# Patient Record
Sex: Female | Born: 1965 | Race: Black or African American | Hispanic: No | Marital: Single | Smoking: Never smoker
Health system: Southern US, Community
[De-identification: ages and names within clinical notes are randomized; demographics above are authoritative.]

## PROBLEM LIST (undated history)

## (undated) DIAGNOSIS — J189 Pneumonia, unspecified organism: Secondary | ICD-10-CM

## (undated) DIAGNOSIS — M1990 Unspecified osteoarthritis, unspecified site: Secondary | ICD-10-CM

## (undated) DIAGNOSIS — D573 Sickle-cell trait: Secondary | ICD-10-CM

## (undated) DIAGNOSIS — T7840XA Allergy, unspecified, initial encounter: Secondary | ICD-10-CM

## (undated) DIAGNOSIS — J45909 Unspecified asthma, uncomplicated: Secondary | ICD-10-CM

## (undated) DIAGNOSIS — R000 Tachycardia, unspecified: Secondary | ICD-10-CM

## (undated) DIAGNOSIS — K859 Acute pancreatitis without necrosis or infection, unspecified: Secondary | ICD-10-CM

## (undated) DIAGNOSIS — I10 Essential (primary) hypertension: Secondary | ICD-10-CM

## (undated) HISTORY — DX: Unspecified osteoarthritis, unspecified site: M19.90

## (undated) HISTORY — DX: Acute pancreatitis without necrosis or infection, unspecified: K85.90

## (undated) HISTORY — DX: Tachycardia, unspecified: R00.0

## (undated) HISTORY — DX: Unspecified asthma, uncomplicated: J45.909

## (undated) HISTORY — DX: Allergy, unspecified, initial encounter: T78.40XA

## (undated) HISTORY — DX: Pneumonia, unspecified organism: J18.9

## (undated) HISTORY — PX: LUNG LOBECTOMY: SHX167

## (undated) HISTORY — PX: WISDOM TOOTH EXTRACTION: SHX21

## (undated) HISTORY — PX: INGUINAL HERNIA REPAIR: SUR1180

## (undated) HISTORY — DX: Sickle-cell trait: D57.3

## (undated) HISTORY — DX: Essential (primary) hypertension: I10

## (undated) SURGERY — Surgical Case
Anesthesia: *Unknown

---

## 2013-10-15 DIAGNOSIS — I471 Supraventricular tachycardia: Secondary | ICD-10-CM | POA: Insufficient documentation

## 2019-10-29 DIAGNOSIS — R002 Palpitations: Secondary | ICD-10-CM | POA: Insufficient documentation

## 2020-01-02 DIAGNOSIS — Z6841 Body Mass Index (BMI) 40.0 and over, adult: Secondary | ICD-10-CM | POA: Insufficient documentation

## 2020-01-24 ENCOUNTER — Emergency Department (HOSPITAL_COMMUNITY)
Admission: EM | Admit: 2020-01-24 | Discharge: 2020-01-25 | Disposition: A | Discharge status: Home / Self Care | Payer: Federal, State, Local not specified - PPO | Attending: Emergency Medicine | Admitting: Emergency Medicine

## 2020-01-24 ENCOUNTER — Encounter (HOSPITAL_COMMUNITY): Payer: Self-pay

## 2020-01-24 ENCOUNTER — Other Ambulatory Visit: Payer: Self-pay

## 2020-01-24 DIAGNOSIS — R1010 Upper abdominal pain, unspecified: Secondary | ICD-10-CM | POA: Diagnosis present

## 2020-01-24 DIAGNOSIS — K859 Acute pancreatitis without necrosis or infection, unspecified: Secondary | ICD-10-CM | POA: Diagnosis not present

## 2020-01-24 DIAGNOSIS — A599 Trichomoniasis, unspecified: Secondary | ICD-10-CM | POA: Diagnosis not present

## 2020-01-24 LAB — COMPREHENSIVE METABOLIC PANEL
ALT: 17 U/L (ref 0–44)
AST: 16 U/L (ref 15–41)
Albumin: 3.7 g/dL (ref 3.5–5.0)
Alkaline Phosphatase: 70 U/L (ref 38–126)
Anion gap: 9 (ref 5–15)
BUN: 7 mg/dL (ref 6–20)
CO2: 28 mmol/L (ref 22–32)
Calcium: 9.5 mg/dL (ref 8.9–10.3)
Chloride: 101 mmol/L (ref 98–111)
Creatinine, Ser: 0.72 mg/dL (ref 0.44–1.00)
GFR calc Af Amer: >60 mL/min (ref >60)
GFR calc non Af Amer: >60 mL/min (ref >60)
Glucose, Bld: 123 mg/dL — ABNORMAL HIGH (ref 70–99)
Potassium: 4.6 mmol/L (ref 3.5–5.1)
Sodium: 138 mmol/L (ref 135–145)
Total Bilirubin: 0.6 mg/dL (ref 0.3–1.2)
Total Protein: 7.6 g/dL (ref 6.5–8.1)

## 2020-01-24 LAB — URINALYSIS, ROUTINE W REFLEX MICROSCOPIC
Bilirubin Urine: NEGATIVE
Glucose, UA: NEGATIVE mg/dL
Ketones, ur: NEGATIVE mg/dL
Nitrite: NEGATIVE
Protein, ur: NEGATIVE mg/dL
Specific Gravity, Urine: 1.01 (ref 1.005–1.030)
pH: 5 (ref 5.0–8.0)

## 2020-01-24 LAB — CBC
HCT: 38.4 % (ref 36.0–46.0)
Hemoglobin: 12.4 g/dL (ref 12.0–15.0)
MCH: 27.1 pg (ref 26.0–34.0)
MCHC: 32.3 g/dL (ref 30.0–36.0)
MCV: 84 fL (ref 80.0–100.0)
Platelets: 316 10*3/uL (ref 150–400)
RBC: 4.57 MIL/uL (ref 3.87–5.11)
RDW: 12.8 % (ref 11.5–15.5)
WBC: 9.7 10*3/uL (ref 4.0–10.5)
nRBC: 0 % (ref 0.0–0.2)

## 2020-01-24 LAB — I-STAT BETA HCG BLOOD, ED (MC, WL, AP ONLY): I-stat hCG, quantitative: <5 m[IU]/mL (ref <5)

## 2020-01-24 LAB — LIPASE, BLOOD: Lipase: 58 U/L — ABNORMAL HIGH (ref 11–51)

## 2020-01-24 MED ORDER — SODIUM CHLORIDE 0.9% FLUSH: 3.0000 mL | Freq: Once | INTRAVENOUS | Status: DC

## 2020-01-24 NOTE — ED Triage Notes (Signed)
Pt arrives to ED w/ c/o abdominal pain x 2 days. Pt endorses nausea but denies v/d. Pt reports 10/10 R/LUQ pain.

## 2020-01-25 ENCOUNTER — Emergency Department (HOSPITAL_COMMUNITY): Payer: Federal, State, Local not specified - PPO

## 2020-01-25 ENCOUNTER — Encounter: Payer: Self-pay | Admitting: Nurse Practitioner

## 2020-01-25 ENCOUNTER — Encounter (HOSPITAL_COMMUNITY): Payer: Self-pay | Admitting: Radiology

## 2020-01-25 MED ORDER — HYDROMORPHONE HCL 1 MG/ML IJ SOLN
1.0000 mg | Freq: Once | INTRAMUSCULAR | Status: AC
  Administered 2020-01-25: 1 mg via INTRAVENOUS
  Filled 2020-01-25: qty 1

## 2020-01-25 MED ORDER — IOHEXOL 300 MG/ML  SOLN
100.0000 mL | Freq: Once | INTRAMUSCULAR | Status: AC | PRN
  Administered 2020-01-25: 100 mL via INTRAVENOUS

## 2020-01-25 MED ORDER — MORPHINE SULFATE (PF) 4 MG/ML IV SOLN
4.0000 mg | Freq: Once | INTRAVENOUS | Status: AC
  Administered 2020-01-25: 4 mg via INTRAVENOUS
  Filled 2020-01-25: qty 1

## 2020-01-25 MED ORDER — OXYCODONE-ACETAMINOPHEN 5-325 MG PO TABS: 1.0000 | ORAL_TABLET | ORAL | 0 refills | Status: DC | PRN

## 2020-01-25 MED ORDER — ONDANSETRON HCL 4 MG/2ML IJ SOLN
4.0000 mg | Freq: Once | INTRAMUSCULAR | Status: AC
  Administered 2020-01-25: 4 mg via INTRAVENOUS
  Filled 2020-01-25: qty 2

## 2020-01-25 MED ORDER — SODIUM CHLORIDE 0.9 % IV BOLUS
1000.0000 mL | Freq: Once | INTRAVENOUS | Status: AC
  Administered 2020-01-25: 1000 mL via INTRAVENOUS

## 2020-01-25 MED ORDER — ONDANSETRON HCL 4 MG PO TABS: 4.0000 mg | ORAL_TABLET | Freq: Four times a day (QID) | ORAL | 0 refills | Status: DC

## 2020-01-25 NOTE — ED Provider Notes (Signed)
[facility] EMERGENCY DEPARTMENT Provider Note   CSN: [id number] Arrival date & time: 01/24/20  1949     History Chief Complaint  Patient presents with   Abdominal Pain    Alicia Ritter is a 54 y.o. female.  Patient presents to the emergency department for evaluation of abdominal pain.  Symptoms began, mild, 2 days ago.  24 hours ago her pain started to worsen.  Pain is sharp and constant across the upper abdomen.  She has had nausea without vomiting.  She has felt mildly constipated.  No fever.  Patient has never had similar pain.  She has had previous hernia surgery, no other abdominal surgeries.        History reviewed. No pertinent past medical history.  There are no problems to display for this patient.   History reviewed. No pertinent surgical history.   OB History   No obstetric history on file.     History reviewed. No pertinent family history.  Social History   Tobacco Use   Smoking status: Not on file  Substance Use Topics   Alcohol use: Not on file   Drug use: Not on file    Home Medications Prior to Admission medications   Medication Sig Start Date End Date Taking? Authorizing Provider  ondansetron (ZOFRAN) 4 MG tablet Take 1 tablet (4 mg total) by mouth every 6 (six) hours. 01/25/20   Orpah Greek, MD  oxyCODONE-acetaminophen (PERCOCET) 5-325 MG tablet Take 1-2 tablets by mouth every 4 (four) hours as needed. 01/25/20   Orpah Greek, MD    Allergies    Patient has no allergy information on record.  Review of Systems   Review of Systems  Gastrointestinal: Positive for abdominal pain, constipation and nausea.  All other systems reviewed and are negative.   Physical Exam Updated Vital Signs BP 133/81    Pulse 78    Temp 98.7 F (37.1 C) (Oral)    Resp 15    SpO2 98%   Physical Exam Vitals and nursing note reviewed.  Constitutional:      General: She is not in acute distress.    Appearance: Normal  appearance. She is well-developed.  HENT:     Head: Normocephalic and atraumatic.     Right Ear: Hearing normal.     Left Ear: Hearing normal.     Nose: Nose normal.  Eyes:     Conjunctiva/sclera: Conjunctivae normal.     Pupils: Pupils are equal, round, and reactive to light.  Cardiovascular:     Rate and Rhythm: Regular rhythm.     Heart sounds: S1 normal and S2 normal. No murmur. No friction rub. No gallop.   Pulmonary:     Effort: Pulmonary effort is normal. No respiratory distress.     Breath sounds: Normal breath sounds.  Chest:     Chest wall: No tenderness.  Abdominal:     General: Bowel sounds are normal.     Palpations: Abdomen is soft.     Tenderness: There is abdominal tenderness in the epigastric area. There is no guarding or rebound. Negative signs include Murphy's sign and McBurney's sign.     Hernia: No hernia is present.  Musculoskeletal:        General: Normal range of motion.     Cervical back: Normal range of motion and neck supple.  Skin:    General: Skin is warm and dry.     Findings: No rash.  Neurological:  Mental Status: She is alert and oriented to person, place, and time.     GCS: GCS eye subscore is 4. GCS verbal subscore is 5. GCS motor subscore is 6.     Cranial Nerves: No cranial nerve deficit.     Sensory: No sensory deficit.     Coordination: Coordination normal.  Psychiatric:        Speech: Speech normal.        Behavior: Behavior normal.        Thought Content: Thought content normal.     ED Results / Procedures / Treatments   Labs (all labs ordered are listed, but only abnormal results are displayed) Labs Reviewed  LIPASE, BLOOD - Abnormal; Notable for the following components:      Result Value   Lipase 58 (*)    All other components within normal limits  COMPREHENSIVE METABOLIC PANEL - Abnormal; Notable for the following components:   Glucose, Bld 123 (*)    All other components within normal limits  URINALYSIS, ROUTINE W  REFLEX MICROSCOPIC - Abnormal; Notable for the following components:   APPearance HAZY (*)    Hgb urine dipstick SMALL (*)    Leukocytes,Ua MODERATE (*)    Bacteria, UA RARE (*)    Trichomonas, UA PRESENT (*)    All other components within normal limits  CBC  I-STAT BETA HCG BLOOD, ED ([facility], [facility], [facility] ONLY)    EKG None  Radiology CT ABDOMEN PELVIS W CONTRAST  Result Date: 01/25/2020 CLINICAL DATA:  Upper abdominal pain and nausea for 2 days. EXAM: CT ABDOMEN AND PELVIS WITH CONTRAST TECHNIQUE: Multidetector CT imaging of the abdomen and pelvis was performed using the standard protocol following bolus administration of intravenous contrast. CONTRAST:  125mL OMNIPAQUE IOHEXOL 300 MG/ML  SOLN COMPARISON:  You have FINDINGS: Lower chest: Minimal atelectasis is present at the lung bases. The heart size is normal. No significant pleural or pericardial effusion is present. Hepatobiliary: Mild diffuse fatty infiltration of the liver is present. No discrete lesions are present. Common bile duct and gallbladder are normal. Pancreas: Inflammatory changes are present about the pancreatic head and uncinate process. The distal body and tail are normal. No mass lesion or fluid collection is present. Spleen: Normal in size without focal abnormality. Adrenals/Urinary Tract: The adrenal glands are normal bilaterally. Kidneys and ureters are within normal limits. The urinary bladder is within normal limits. Stomach/Bowel: The stomach is within normal limits. Portions the duodenum demonstrate inflammatory changes, likely secondary to the pancreatic inflammation. Small bowel is unremarkable. Terminal ileum is normal. The appendix is visualized and normal. The ascending and transverse colon are normal. Diverticular changes are present in the descending and sigmoid colon without focal inflammation to suggest diverticulitis. Vascular/Lymphatic: No significant vascular findings are present. No enlarged abdominal or pelvic lymph  nodes. Reproductive: Uterus and bilateral adnexa are unremarkable. Other: No abdominal wall hernia or abnormality. No abdominopelvic ascites. Musculoskeletal: Degenerative facet changes are greatest at L4-5 and worse on the left at L5-S1. Vertebral body heights are maintained. No focal lytic or blastic lesions are present. The bony pelvis is normal. Hips are located and within normal limits. IMPRESSION: 1. Inflammatory changes about the pancreatic head and uncinate process compatible with acute pancreatitis. No complicating features are present. 2. Inflammatory changes of the duodenum are likely secondary to the pancreatic inflammation. 3. Hepatic steatosis. 4. Descending and sigmoid diverticulosis without diverticulitis. 5. Degenerative facet changes in the lower lumbar spine. Electronically Signed   By: San Morelle  M.D.   On: 01/25/2020 06:12    Procedures Procedures (including critical care time)  Medications Ordered in ED Medications  sodium chloride flush (NS) 0.9 % injection 3 mL (has no administration in time range)  HYDROmorphone (DILAUDID) injection 1 mg (has no administration in time range)  sodium chloride 0.9 % bolus 1,000 mL (0 mLs Intravenous Stopped 01/25/20 0609)  morphine 4 MG/ML injection 4 mg (4 mg Intravenous Given 01/25/20 0514)  ondansetron (ZOFRAN) injection 4 mg (4 mg Intravenous Given 01/25/20 0515)  iohexol (OMNIPAQUE) 300 MG/ML solution 100 mL (100 mLs Intravenous Contrast Given 01/25/20 0529)    ED Course  I have reviewed the triage vital signs and the nursing notes.  Pertinent labs & imaging results that were available during my care of the patient were reviewed by me and considered in my medical decision making (see chart for details).    MDM Rules/Calculators/A&P                      Patient presents to the emergency department for evaluation of abdominal pain.  Patient has had 2 days of abdominal pain, severe pain for the last 24 hours.  Pain is epigastric in  region.  Examination revealed tenderness in the epigastric area but no guarding or rebound.  Lab work was largely unremarkable other than a lipase of 58.  No leukocytosis, no LFT abnormality.  CT scan was performed and does show inflammatory changes around the head of the pancreas consistent with pancreatitis.  No complicating features.  Patient reports that she was treated this past week with Augmentin for a UTI.  This might be the cause of her pancreatitis as she denies any alcohol intake.  She has a normal common bile duct and gallbladder, no evidence of stone disease causing pancreatitis.  Urinalysis today did show moderate leukocytes with 6-10 white cells.  I will culture the urine but suspect that this is not a UTI.  She did have trichomonas present in the urine.  Patient tells me that she has not had sexual intercourse since before the Covid pandemic, which is more than a year.  PID is therefore not a consideration.  I will hold off on treatment of the trichomonas until after the pancreatitis flare.  I do not feel that she requires hospitalization at this time.  We will have her follow-up with her primary care doctor and gastroenterology.  She was given return precautions including fever and worsening uncontrolled pain.  Final Clinical Impression(s) / ED Diagnoses Final diagnoses:  Trichomoniasis  Acute pancreatitis without infection or necrosis, unspecified pancreatitis type    Rx / DC Orders ED Discharge Orders         Ordered    oxyCODONE-acetaminophen (PERCOCET) 5-325 MG tablet  Every 4 hours PRN     01/25/20 0644    ondansetron (ZOFRAN) 4 MG tablet  Every 6 hours     01/25/20 0644           Orpah Greek, MD 01/25/20 [id number]

## 2020-01-25 NOTE — Discharge Instructions (Signed)
Schedule follow-up with the listed gastroenterologist to follow-up on your inflamed pancreas.  Schedule follow-up with your primary care doctor to recheck and treat the trichomoniasis after the pancreas inflammation has resolved.

## 2020-02-07 ENCOUNTER — Ambulatory Visit (INDEPENDENT_AMBULATORY_CARE_PROVIDER_SITE_OTHER): Payer: Federal, State, Local not specified - PPO | Admitting: Nurse Practitioner

## 2020-02-07 ENCOUNTER — Encounter: Payer: Self-pay | Admitting: Nurse Practitioner

## 2020-02-07 VITALS — BP 124/80 | HR 72 | Temp 98.2°F | Ht 67.0 in | Wt 280.5 lb

## 2020-02-07 DIAGNOSIS — K5900 Constipation, unspecified: Secondary | ICD-10-CM | POA: Diagnosis not present

## 2020-02-07 DIAGNOSIS — K859 Acute pancreatitis without necrosis or infection, unspecified: Secondary | ICD-10-CM | POA: Diagnosis not present

## 2020-02-07 NOTE — Progress Notes (Signed)
ASSESSMENT / PLAN:   #Acute pancreatitis --Etiology unclear.  Rare alcohol use.  Non-smoker.  No gallstones on CT scan / no biliary duct dilation and liver chemistries normal all speaking again biliary pancreatitis.   No family history of pancreatic diseases.  She did recently have Cipro, a rare but possible culprit. She also recently took Augmentin but I don't know it has been reported to cause pancreatitis? None of her usual home meds are suspicious. Neoplasm cannot be excluded of course. Autoimmune pancreatitis unusual but not excluded.  --Still with some very mild intermittent upper abdominal discomfort.  I recommended a low-fat diet until feeling better --Though her liver test were normal will obtain ultrasound to look for cholelithiasis/sludge --She will likely need follow-up CT scan in a few weeks to ensure resolution of pancreatitis and also exclude any underlying masses. --Will call her with ultrasound results  #Mild constipation --Probably result of recent illness and a few doses of opioids --Increase water intake to 64 ounces daily --Trial of Benefiber qday  #Colon cancer screening --Patient will need a screening colonoscopy after resolution of pancreatitis  HPI:     Chief Complaint:  ED follow up    Alicia Ritter was in the ED 01/24/20 for evaluation of upper abdominal pain.  The pain started the day prior.  It was a crampy type upper abdominal pain.  Patient took Tums which helped for a little while.  She took Tylenol when the pain returned and that helped for a little while.  Eventually she ended up going to the emergency department for unrelenting sharp upper abdominal pain and nausea.  In the ED her CMP was basically unremarkable, lipase was 58, CBC normal, pregnancy test negative.  CT scan showed normal gallbladder, normal common bile duct.  There were inflammatory changes about the pancreatic head and uncinate process.  There were inflammatory changes of the  duodenum which were possibly reactive.  Alicia Ritter has no prior history of pancreatitis.  She rarely consumes alcohol.  No family history of pancreatic problems.  She is a non-smoker.  She took Cipro followed by Augmentin about a week prior to onset of abdominal pain, no other new medications.  She is feeling much better but still has occasional mild upper abdominal discomfort/bloating.  No nausea or vomiting now.  She is eating and drinking without problems.  No unusual weight loss. Following a vegetarian diet for the time being.   Past Medical History:  Diagnosis Date  . Asthma   . HTN (hypertension)   . Pancreatitis   . Pneumonia      Past Surgical History:  Procedure Laterality Date  . INGUINAL HERNIA REPAIR Bilateral   . LUNG LOBECTOMY Right    Family History  Problem Relation Age of Onset  . Diabetes Mother   . Hypertension Mother   . Prostate cancer Father   . Stroke Brother   . Heart attack Brother   . Diabetes Maternal Grandmother   . Hypertension Maternal Grandmother    Social History   Tobacco Use  . Smoking status: Never Smoker  . Smokeless tobacco: Never Used  Substance Use Topics  . Alcohol use: Yes    Comment: occasional  . Drug use: Never   Current Outpatient Medications  Medication Sig Dispense Refill  . albuterol (PROVENTIL HFA) 108 (90 Base) MCG/ACT inhaler Inhale 1 puff into the lungs as needed.    . Cholecalciferol 50 MCG (2000 UT) CAPS  Take 1 tablet by mouth daily.    . fluticasone (FLONASE) 50 MCG/ACT nasal spray Place 1 spray into the nose daily.    . Multiple Vitamin (MULTI-VITAMIN) tablet Take 1 tablet by mouth daily.    . ondansetron (ZOFRAN) 4 MG tablet Take 1 tablet (4 mg total) by mouth every 6 (six) hours. 12 tablet 0  . oxyCODONE-acetaminophen (PERCOCET) 5-325 MG tablet Take 1-2 tablets by mouth every 4 (four) hours as needed. 20 tablet 0  . verapamil (CALAN-SR) 180 MG CR tablet Take 180 mg by mouth daily.    Grant Ruts INHUB 250-50 MCG/DOSE  AEPB Inhale 1 puff into the lungs 2 (two) times daily.     No current facility-administered medications for this visit.   No Known Allergies   Review of Systems: Positive for allergies, heart rhythm changes and excessive urination.  All other systems reviewed and negative except where noted in HPI.   Serum creatinine: 0.72 mg/dL 01/24/20 2017 Estimated creatinine clearance: 111.4 mL/min   Physical Exam:    Wt Readings from Last 3 Encounters:  02/07/20 280 lb 8 oz (127.2 kg)    BP 124/80 (BP Location: Left Arm, Patient Position: Sitting, Cuff Size: Normal)   Pulse 72   Temp 98.2 F (36.8 C)   Ht 5\' 7"  (1.702 m) Comment: height measured without shoes  Wt 280 lb 8 oz (127.2 kg)   BMI 43.93 kg/m  Constitutional:  Pleasant female in no acute distress. Psychiatric: Normal mood and affect. Behavior is normal. EENT: Pupils normal.  Conjunctivae are normal. No scleral icterus. Neck supple.  Cardiovascular: Normal rate, regular rhythm. No edema Pulmonary/chest: Effort normal and breath sounds normal. No wheezing, rales or rhonchi. Abdominal: Soft, nondistended, nontender. Bowel sounds active throughout. There are no masses palpable. No hepatomegaly. Neurological: Alert and oriented to person place and time. Skin: Skin is warm and dry. No rashes noted.  Tye Savoy, NP  02/07/2020, 10:43 AM

## 2020-02-07 NOTE — Progress Notes (Signed)
Agree with assessment and plan as outlined.  

## 2020-02-07 NOTE — Patient Instructions (Signed)
If you are age 55 or older, your body mass index should be between 23-30. Your Body mass index is 43.93 kg/m. If this is out of the aforementioned range listed, please consider follow up with your Primary Care Provider.  If you are age 44 or younger, your body mass index should be between 19-25. Your Body mass index is 43.93 kg/m. If this is out of the aformentioned range listed, please consider follow up with your Primary Care Provider.   You have been scheduled for an abdominal ultrasound at [facility] Radiology (1st floor of hospital) on 02/14/20 at 9:15 am. Please arrive 15 minutes prior to your appointment for registration. Make certain not to have anything to eat or drink starting at midnight. Should you need to reschedule your appointment, please contact radiology at [phone]. This test typically takes about 30 minutes to perform.  Be sure to increase your water intake to at least 64 ounces of water daily.  Add Benefiber daily  We will call you with your ultrasound results and discuss follow up at that time.

## 2020-02-14 ENCOUNTER — Other Ambulatory Visit: Payer: Self-pay

## 2020-02-14 ENCOUNTER — Ambulatory Visit (HOSPITAL_COMMUNITY)
Admission: RE | Admit: 2020-02-14 | Discharge: 2020-02-14 | Disposition: A | Discharge status: Home / Self Care | Payer: Federal, State, Local not specified - PPO | Source: Ambulatory Visit | Attending: Nurse Practitioner | Admitting: Nurse Practitioner

## 2020-02-14 DIAGNOSIS — K859 Acute pancreatitis without necrosis or infection, unspecified: Secondary | ICD-10-CM | POA: Diagnosis present

## 2020-02-14 DIAGNOSIS — K5900 Constipation, unspecified: Secondary | ICD-10-CM

## 2020-08-04 ENCOUNTER — Encounter (HOSPITAL_COMMUNITY): Payer: Self-pay | Admitting: Emergency Medicine

## 2020-08-04 ENCOUNTER — Emergency Department (HOSPITAL_COMMUNITY): Payer: Federal, State, Local not specified - PPO

## 2020-08-04 ENCOUNTER — Emergency Department (HOSPITAL_COMMUNITY)
Admission: EM | Admit: 2020-08-04 | Discharge: 2020-08-05 | Disposition: A | Discharge status: Home / Self Care | Payer: Federal, State, Local not specified - PPO | Attending: Emergency Medicine | Admitting: Emergency Medicine

## 2020-08-04 DIAGNOSIS — R0602 Shortness of breath: Secondary | ICD-10-CM | POA: Insufficient documentation

## 2020-08-04 DIAGNOSIS — R202 Paresthesia of skin: Secondary | ICD-10-CM | POA: Insufficient documentation

## 2020-08-04 DIAGNOSIS — R062 Wheezing: Secondary | ICD-10-CM | POA: Diagnosis not present

## 2020-08-04 DIAGNOSIS — I10 Essential (primary) hypertension: Secondary | ICD-10-CM | POA: Diagnosis not present

## 2020-08-04 DIAGNOSIS — R0789 Other chest pain: Secondary | ICD-10-CM | POA: Diagnosis not present

## 2020-08-04 DIAGNOSIS — Z79899 Other long term (current) drug therapy: Secondary | ICD-10-CM | POA: Diagnosis not present

## 2020-08-04 LAB — I-STAT BETA HCG BLOOD, ED (MC, WL, AP ONLY): I-stat hCG, quantitative: <5 m[IU]/mL (ref <5)

## 2020-08-04 LAB — CBC
HCT: 38.2 % (ref 36.0–46.0)
Hemoglobin: 11.9 g/dL — ABNORMAL LOW (ref 12.0–15.0)
MCH: 26.7 pg (ref 26.0–34.0)
MCHC: 31.2 g/dL (ref 30.0–36.0)
MCV: 85.7 fL (ref 80.0–100.0)
Platelets: 342 10*3/uL (ref 150–400)
RBC: 4.46 MIL/uL (ref 3.87–5.11)
RDW: 13.3 % (ref 11.5–15.5)
WBC: 6.3 10*3/uL (ref 4.0–10.5)
nRBC: 0 % (ref 0.0–0.2)

## 2020-08-04 LAB — BASIC METABOLIC PANEL
Anion gap: 9 (ref 5–15)
BUN: 10 mg/dL (ref 6–20)
CO2: 28 mmol/L (ref 22–32)
Calcium: 9.2 mg/dL (ref 8.9–10.3)
Chloride: 101 mmol/L (ref 98–111)
Creatinine, Ser: 0.68 mg/dL (ref 0.44–1.00)
GFR, Estimated: >60 mL/min (ref >60)
Glucose, Bld: 100 mg/dL — ABNORMAL HIGH (ref 70–99)
Potassium: 3.8 mmol/L (ref 3.5–5.1)
Sodium: 138 mmol/L (ref 135–145)

## 2020-08-04 LAB — TROPONIN I (HIGH SENSITIVITY)
Troponin I (High Sensitivity): 3 ng/L (ref <18)
Troponin I (High Sensitivity): 2 ng/L (ref <18)

## 2020-08-04 NOTE — ED Triage Notes (Signed)
Pt her e from home with c/o left sided chest pain radiates to the center of chest , 324 mg asa , no nitro ,VSS

## 2020-08-05 ENCOUNTER — Other Ambulatory Visit: Payer: Self-pay

## 2020-08-05 MED ORDER — DIPHENHYDRAMINE HCL 25 MG PO CAPS: 25.0000 mg | ORAL_CAPSULE | Freq: Once | ORAL | Status: DC

## 2020-08-05 MED ORDER — MORPHINE SULFATE (PF) 4 MG/ML IV SOLN: 4.0000 mg | Freq: Once | INTRAVENOUS | Status: DC

## 2020-08-05 MED ORDER — SODIUM CHLORIDE 0.9 % IV BOLUS: 1000.0000 mL | Freq: Once | INTRAVENOUS | Status: DC

## 2020-08-05 NOTE — Discharge Instructions (Signed)
Thank you for allowing me to care for you today in the Emergency Department.   Your work-up today was reassuring.  You did have minimal wheezing noted to your left lung.  You can use your albuterol inhaler as prescribed for wheezing or chest tightness.  Please keep your follow-up appoint with cardiology in the next few weeks.  If you continue to have intermittent symptoms, you can follow-up with primary care.  If you develop new or concerning symptoms, including a rash over the area with pain, respiratory distress, if your fingers or lips turn blue, if you pass out, and chest pain with significant sweating or uncontrollable vomiting, you should return to the emergency department for reevaluation.

## 2020-08-05 NOTE — ED Provider Notes (Signed)
[facility] EMERGENCY DEPARTMENT Provider Note   CSN: [id number] Arrival date & time: 08/04/20  1236     History Chief Complaint  Patient presents with  . Chest Pain    Alicia Ritter is a 54 y.o. 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  The patient reports that she was getting ready for work when she suddenly developed pain in her left lateral chest, inferior to her left axilla that radiated around her chest below her left breast.  Pain was sharp.  It lasted for several seconds and was accompanied by shortness of breath and tingling in her left hand.  She had a second episode, similar to the first that began shortly after the first episode ended, which prompted her visit to the ER.  She reports that tingling and shortness of breath persisted for some time after the chest pain subsided.  Shortness of breath resolves shortly after arriving to the ER.  Tingling in her left hands resolved prior to arrival in the ER.  She has had no further episodes of shortness of breath, tingling, or chest pain.  She denies nausea, vomiting, headache, dizziness, lightheaded, palpitations, visual changes, back pain, neck pain, abdominal pain, flank pain, breast pain, rash, leg swelling.  No recent falls or injuries.  She denies any known injuries or trauma to her left ribs.  No history of similar pain.  She has been compliant with her home medications.  She is established with cardiology.  The history is provided by the patient. No language interpreter was used.    HPI: A 54 year old patient with a history of hypertension and obesity presents for evaluation of chest pain. Initial onset of pain was more than 6 hours ago. The patient's chest pain is well-localized, is sharp and is not worse with exertion. The patient's chest pain is middle- or left-sided, is not described as  heaviness/pressure/tightness and does radiate to the arms/jaw/neck. The patient does not complain of nausea and denies diaphoresis. The patient has a family history of coronary artery disease in a first-degree relative with onset less than age 91. The patient has no history of stroke, has no history of peripheral artery disease, has not smoked in the past 90 days, denies any history of treated diabetes and has no history of hypercholesterolemia.   Past Medical History:  Diagnosis Date  . Asthma   . HTN (hypertension)   . Pancreatitis   . Pneumonia     There are no problems to display for this patient.   Past Surgical History:  Procedure Laterality Date  . INGUINAL HERNIA REPAIR Bilateral   . LUNG LOBECTOMY Right      OB History   No obstetric history on file.     Family History  Problem Relation Age of Onset  . Diabetes Mother   . Hypertension Mother   . Prostate cancer Father   . Stroke Brother   . Heart attack Brother   . Diabetes Maternal Grandmother   . Hypertension Maternal Grandmother     Social History   Tobacco Use  . Smoking status: Never Smoker  . Smokeless tobacco: Never Used  Vaping Use  . Vaping Use: Never used  Substance Use Topics  . Alcohol use: Yes    Comment: occasional  . Drug use: Never    Home Medications Prior to Admission medications   Medication Sig Start Date End Date  Taking? Authorizing Provider  albuterol (PROVENTIL HFA) 108 (90 Base) MCG/ACT inhaler Inhale 1 puff into the lungs as needed. 08/07/13   [provider]  Cholecalciferol 50 MCG (2000 UT) CAPS Take 1 tablet by mouth daily.    [provider]  fluticasone (FLONASE) 50 MCG/ACT nasal spray Place 1 spray into the nose daily. 09/25/13   [provider]  Multiple Vitamin (MULTI-VITAMIN) tablet Take 1 tablet by mouth daily.    [provider]  ondansetron (ZOFRAN) 4 MG tablet Take 1 tablet (4 mg total) by mouth every 6 (six) hours. 01/25/20    Orpah Greek, MD  oxyCODONE-acetaminophen (PERCOCET) 5-325 MG tablet Take 1-2 tablets by mouth every 4 (four) hours as needed. 01/25/20   Orpah Greek, MD  verapamil (CALAN-SR) 180 MG CR tablet Take 180 mg by mouth daily. 01/05/20   [provider]  Grant Ruts INHUB 250-50 MCG/DOSE AEPB Inhale 1 puff into the lungs 2 (two) times daily. 01/03/20   [provider]    Allergies    Patient has no known allergies.  Review of Systems   Review of Systems  Constitutional: Negative for activity change, chills, diaphoresis and fever.  HENT: Negative for congestion and sore throat.   Eyes: Negative for visual disturbance.  Respiratory: Positive for shortness of breath. Negative for cough and wheezing.   Cardiovascular: Positive for chest pain. Negative for palpitations and leg swelling.  Gastrointestinal: Negative for abdominal pain, diarrhea, nausea and vomiting.  Genitourinary: Negative for dysuria.  Musculoskeletal: Negative for back pain, myalgias, neck pain and neck stiffness.  Skin: Negative for rash.  Allergic/Immunologic: Negative for immunocompromised state.  Neurological: Negative for dizziness, seizures, syncope, weakness, numbness and headaches.       Paresthesias  Psychiatric/Behavioral: Negative for confusion.    Physical Exam Updated Vital Signs BP 135/75   Pulse 68   Temp 97.7 F (36.5 C) (Oral)   Resp 18   Ht 5\' 7"  (1.702 m)   Wt 127 kg   SpO2 98%   BMI 43.85 kg/m   Physical Exam Vitals and nursing note reviewed.  Constitutional:      General: She is not in acute distress.    Appearance: She is not ill-appearing, toxic-appearing or diaphoretic.     Comments: Well-appearing.  No acute distress.  HENT:     Head: Normocephalic.  Eyes:     Conjunctiva/sclera: Conjunctivae normal.  Cardiovascular:     Rate and Rhythm: Normal rate and regular rhythm.     Pulses: Normal pulses.     Heart sounds: Normal heart sounds. No murmur heard.    No friction rub. No gallop.   Pulmonary:     Effort: Pulmonary effort is normal. No respiratory distress.     Breath sounds: No stridor. No wheezing, rhonchi or rales.     Comments: No reproducible tenderness to palpation to the chest wall or to the bilateral ribs.  No rashes. Chest:     Chest wall: No tenderness.  Abdominal:     General: There is no distension.     Palpations: Abdomen is soft. There is no mass.     Tenderness: There is no abdominal tenderness. There is no right CVA tenderness, left CVA tenderness, guarding or rebound.     Hernia: No hernia is present.  Musculoskeletal:     Cervical back: Neck supple.  Skin:    General: Skin is warm.     Findings: No rash.  Neurological:     Mental  Status: She is alert.     Comments: Sensation is intact and equal throughout.  5 of 5 strength against resistance of the bilateral upper and lower extremities.  No focal neurologic deficits.  Psychiatric:        Behavior: Behavior normal.     ED Results / Procedures / Treatments   Labs (all labs ordered are listed, but only abnormal results are displayed) Labs Reviewed  BASIC METABOLIC PANEL - Abnormal; Notable for the following components:      Result Value   Glucose, Bld 100 (*)    All other components within normal limits  CBC - Abnormal; Notable for the following components:   Hemoglobin 11.9 (*)    All other components within normal limits  I-STAT BETA HCG BLOOD, ED ([facility], [facility], [facility] ONLY)  TROPONIN I (HIGH SENSITIVITY)  TROPONIN I (HIGH SENSITIVITY)    EKG EKG Interpretation  Date/Time:  Monday August 04 2020 12:37:59 EDT Ventricular Rate:  64 PR Interval:  154 QRS Duration: 76 QT Interval:  376 QTC Calculation: 387 R Axis:   20 Text Interpretation: Normal sinus rhythm with sinus arrhythmia Normal ECG No old tracing to compare Confirmed by Daleen Bo [phone]) on 08/04/2020 9:17:49 PM   Radiology DG Chest 2 View  Result Date: 08/04/2020 CLINICAL DATA:  Chest  pain EXAM: CHEST - 2 VIEW COMPARISON:  None. FINDINGS: Minimal left basilar atelectasis. No pneumothorax or pleural effusion. Cardiomediastinal silhouette within normal limits. No acute osseous abnormality. IMPRESSION: Minimal left basilar atelectasis. Otherwise no focal airspace disease. Electronically Signed   By: Primitivo Gauze M.D.   On: 08/04/2020 13:10    Procedures Procedures (including critical care time)  Medications Ordered in ED Medications - No data to display  ED Course  I have reviewed the triage vital signs and the nursing notes.  Pertinent labs & imaging results that were available during my care of the patient were reviewed by me and considered in my medical decision making (see chart for details).    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  Vital signs are reassuring.  Physical exam is reassuring.  Labs have been reviewed and independently interpreted by me.  EKG with normal sinus rhythm with sinus arrhythmia.  Chest x-ray with minimal left basilar atelectasis.  No consolidation or hyperinflation.  On her exam, she did have end expiratory wheezes in the left apices and midlung fields, but no wheezes noted in the bases.  Right lung was clear to auscultation.  She has an albuterol inhaler for home use.  No indication for nebulizer treatment at this time.  Delta troponin is not elevated. HEAR score is 3.   She has a hemoglobin of 11.9, but minimally changed from previous.  No electrolyte derangements.  Labs are otherwise unremarkable.  Her symptoms are very atypical for ACS.  Considered herpes zoster, but there is no rash.  She had no evidence of trauma or injuries to the chest wall.  Pain was not reproducible.  She is PERC  negative and doubt PE.  Doubt tension pneumothorax, pneumonia, aortic dissection.  Patient is established with cardiology and has a follow-up appointment in the next few weeks, which I  have encouraged her to keep.  Discussed that she does have faint wheezing and should use her home albuterol inhaler as needed.  All labs and imaging discussed.  All questions answered.  Patient is agreeable with plan to discharge home and continue follow-up with cardiology.  ER return precautions given.  She is hemodynamically stable and in no acute distress.  Safe for discharge to home with outpatient follow-up as indicated.  Final Clinical Impression(s) / ED Diagnoses Final diagnoses:  Atypical chest pain  Wheezing    Rx / DC Orders ED Discharge Orders    None       Joanne Gavel, PA-C 08/05/20 0758    Merryl Hacker, MD 08/06/20 [phone]

## 2020-09-01 DIAGNOSIS — Z8719 Personal history of other diseases of the digestive system: Secondary | ICD-10-CM | POA: Insufficient documentation

## 2020-09-01 DIAGNOSIS — R0789 Other chest pain: Secondary | ICD-10-CM | POA: Insufficient documentation

## 2020-09-01 DIAGNOSIS — E785 Hyperlipidemia, unspecified: Secondary | ICD-10-CM | POA: Insufficient documentation

## 2020-09-02 ENCOUNTER — Ambulatory Visit: Payer: Federal, State, Local not specified - PPO | Admitting: Nurse Practitioner

## 2020-09-02 ENCOUNTER — Encounter: Payer: Self-pay | Admitting: Nurse Practitioner

## 2020-09-02 ENCOUNTER — Other Ambulatory Visit (INDEPENDENT_AMBULATORY_CARE_PROVIDER_SITE_OTHER): Payer: Federal, State, Local not specified - PPO

## 2020-09-02 VITALS — BP 120/80 | HR 67 | Ht 67.0 in | Wt 282.0 lb

## 2020-09-02 DIAGNOSIS — Z1211 Encounter for screening for malignant neoplasm of colon: Secondary | ICD-10-CM | POA: Diagnosis not present

## 2020-09-02 DIAGNOSIS — R1010 Upper abdominal pain, unspecified: Secondary | ICD-10-CM

## 2020-09-02 DIAGNOSIS — D649 Anemia, unspecified: Secondary | ICD-10-CM | POA: Diagnosis not present

## 2020-09-02 DIAGNOSIS — Z8719 Personal history of other diseases of the digestive system: Secondary | ICD-10-CM

## 2020-09-02 DIAGNOSIS — K5900 Constipation, unspecified: Secondary | ICD-10-CM | POA: Diagnosis not present

## 2020-09-02 LAB — BASIC METABOLIC PANEL
BUN: 9 mg/dL (ref 6–23)
CO2: 29 mEq/L (ref 19–32)
Calcium: 8.8 mg/dL (ref 8.4–10.5)
Chloride: 101 mEq/L (ref 96–112)
Creatinine, Ser: 0.66 mg/dL (ref 0.40–1.20)
GFR: 99.18 mL/min (ref >60.00)
Glucose, Bld: 93 mg/dL (ref 70–99)
Potassium: 4 mEq/L (ref 3.5–5.1)
Sodium: 136 mEq/L (ref 135–145)

## 2020-09-02 LAB — IBC + FERRITIN
Ferritin: 21.6 ng/mL (ref 10.0–291.0)
Iron: 43 ug/dL (ref 42–145)
Saturation Ratios: 10.2 % — ABNORMAL LOW (ref 20.0–50.0)
Transferrin: 300 mg/dL (ref 212.0–360.0)

## 2020-09-02 LAB — LIPASE: Lipase: 24 U/L (ref 11.0–59.0)

## 2020-09-02 MED ORDER — SUTAB 1479-225-188 MG PO TABS: 1.0000 | ORAL_TABLET | Freq: Once | ORAL | 0 refills | Status: AC

## 2020-09-02 NOTE — Progress Notes (Signed)
ASSESSMENT AND PLAN    # Recent upper abdominal pain, bloaitng and nausea reminiscentt of pancreatic but much milder than before. Symptoms resolved after a few days of pain meds, anti-emetics and decreased PO intake.   --obtain Lipase. Needs CT scan, see below.  --If has recurrent pain in future I asked her to call ASAP to get labs done to evaluate for recurrent pancreatitis. If she has additional episodes without evidence for pancreatitis then consider EGD. Doesn't seem necessary at this point ( unless labs return iron deficient)   # Hx of acute pancreatitis in April 2021, unclear etiology. Ultrasound negative. No suspicious medications. She doesn't consume Etoh.  --Will obtain CT scan to evaluate for evidence of recent recurrent pancreatitis and also to exclude underlying pancreatic neoplasm.   --BMET to check renal function prior to CT scan  # Constipation --Continue daily Benefiber, 60 oz water daily. Can add Miralax 1 capful daily as needed  # Recent ED visit for chest pain ( late October). Resolved. Troponin normal. EKG with NSR with arrthythmia. Mild atelectasis on CXR. Saw Cardiology yesterday,sounds like they think pain was muscular but getting a stress test next week.   # Colon cancer screening.  --Patient will be scheduled for a screening colonoscopy. The risks and benefits of colonoscopy with possible polypectomy / biopsies were discussed and the patient agrees to proceed. Will schedule procedure out a bit to allow for results of stress test.   # Mild [place] anemia. Hgb 11.9. No overt GI bleeding --Check iron studies  # Asthma, no wheezing on exam today.   # Obesity, she is trying to lose weight.   HISTORY OF PRESENT ILLNESS     Primary Gastroenterologist : [place] Cellar, MD  Chief Complaint : upper abdominal pain and nausea ( both resolved). Constipation.   Alicia Ritter is a 54 y.o. female with PMH / [place] significant for,  but not necessarily limited to: SVT,  HTN, obesity, pancreatitis, asthma  [place] was seen here late April after being seen in the ED 01/24/20 for abdominal pain / acute pancreatitis.  CT scan showed him laboratory changes about the pancreatic head and uncinate process.  Patient had no prior history of pancreatitis, she rarely consumes alcohol.  No family history of pancreatic problems.  Non-smoker.  Prior to the onset of abdominal pain she had taken Cipro followed by Augmentin.  When I saw her in clinic her pain had nearly resolved.  She has not had any unusual weight loss.  She was sent for an ultrasound to look for cholelithiasis as possible cause of pancreatitis.  Right upper quadrant ultrasound was negative for cholelithiasis.  CBD measured 0.3 mm.  Interval History:   A couple of weeks ago developed recurrent bloating, nausea and non-radiating upper abdominal pain ( non-radiating). Symptoms felt like when he had pancreatitis but to a milder degree. She reduced PO intake, took pain medication and ant-emetics left over from last episode in April. Her symptoms lasted about three days . She feels fine now. No significant NSAID use.   Following above, on 08/05/20 patient went to the ED for evaluation of chest pain and shortness of breath as well as tingling in her left hand. Symptoms started acutely while patient was doing her hair.  EKG showed normal sinus rhythm with sinus arrhythmia.  Troponin was negative.  ED provider noted wheezing.  Chest x-ray showed minimal left basilar atelectasis.  Patient saw cardiology yesterday.  Patient says cardiology feels pain was more  muscular in nature but she is scheduled for a stress test next week  Her bowel movements are a little sluggish. No blood in stool. Having a BM about QOD but feels volume is inadequate.  Took Dulcolax liquid last Saturday and had a great BM Taking Benefiber everyday. Drinking 60 oz water daily.  Data Reviewed: 08/04/20 hgb 11.9 MCV 85 Platelets 342  02/14/20 RUQ [place] --  negative for cholelithiasis.  No gallbladder wall thickening . CBD measured 0.3 mm.  01/25/20 CT scan  abd/ pelvis with contrast Inflammatory changes about the pancreatic head and uncinate process compatible with acute pancreatitis. No complicating features are present. Inflammatory changes of the duodenum are likely secondary to the pancreatic inflammation. Hepatic steatosis. Descending and sigmoid diverticulosis without diverticulitis.  Degenerative facet changes in the lower lumbar spine.   Previous Endoscopic Evaluations / Pertinent Studies:  none  Past Medical History:  Diagnosis Date  . Asthma   . HTN (hypertension)   . Pancreatitis   . Pneumonia     Current Medications, Allergies, Past Surgical History, Family History and Social History were reviewed in [employer] record.   Current Outpatient Medications  Medication Sig Dispense Refill  . albuterol (PROVENTIL HFA) 108 (90 Base) MCG/ACT inhaler Inhale 1 puff into the lungs as needed.    . Cholecalciferol 50 MCG (2000 UT) CAPS Take 1 tablet by mouth daily.    . fluticasone (FLONASE) 50 MCG/ACT nasal spray Place 1 spray into the nose daily.    . Multiple Vitamin (MULTI-VITAMIN) tablet Take 1 tablet by mouth daily.    . ondansetron (ZOFRAN) 4 MG tablet Take 1 tablet (4 mg total) by mouth every 6 (six) hours. 12 tablet 0  . oxyCODONE-acetaminophen (PERCOCET) 5-325 MG tablet Take 1-2 tablets by mouth every 4 (four) hours as needed. 20 tablet 0  . verapamil (CALAN-SR) 180 MG CR tablet Take 180 mg by mouth daily.    Grant Ruts INHUB 250-50 MCG/DOSE AEPB Inhale 1 puff into the lungs 2 (two) times daily.     No current facility-administered medications for this visit.    Review of Systems: No chest pain. No shortness of breath. No urinary complaints.   PHYSICAL EXAM :    Wt Readings from Last 3 Encounters:  09/02/20 282 lb (127.9 kg)  08/05/20 280 lb (127 kg)  02/07/20 280 lb 8 oz (127.2 kg)    BP  120/80   Pulse 67   Ht 5\' 7"  (1.702 m)   Wt 282 lb (127.9 kg)   BMI 44.17 kg/m  Constitutional:  Pleasant female in no acute distress. Psychiatric: Normal mood and affect. Behavior is normal. EENT: Pupils normal.  Conjunctivae are normal. No scleral icterus. Neck supple.  Cardiovascular: Normal rate, regular rhythm. No edema Pulmonary/chest: Effort normal and breath sounds normal. No wheezing, rales or rhonchi. Abdominal: Soft, nondistended, nontender. Bowel sounds active throughout. There are no masses palpable. No hepatomegaly. Neurological: Alert and oriented to person place and time. Skin: Skin is warm and dry. No rashes noted.  Tye Savoy, NP  09/02/2020, 9:19 AM

## 2020-09-02 NOTE — Patient Instructions (Signed)
If you are age 54 or older, your body mass index should be between 23-30. Your Body mass index is 44.17 kg/m. If this is out of the aforementioned range listed, please consider follow up with your Primary Care Provider.  If you are age 54 or younger, your body mass index should be between 19-25. Your Body mass index is 44.17 kg/m. If this is out of the aformentioned range listed, please consider follow up with your Primary Care Provider.   Your provider has requested that you go to the basement level for lab work before leaving today. Press "B" on the elevator. The lab is located at the first door on the left as you exit the elevator.  Miralax 1 capful in 8 ounces of liquid as needed.  You have been scheduled for a CT scan of the abdomen and pelvis at [facility] ([address]---this is in the same building as [employer]).   You are scheduled on Monday 09/22/20 at 10 am. You should arrive 15 minutes prior to your appointment time for registration. Please follow the written instructions below on the day of your exam:  WARNING: IF YOU ARE ALLERGIC TO IODINE/X-RAY DYE, PLEASE NOTIFY RADIOLOGY IMMEDIATELY AT [phone]! YOU WILL BE GIVEN A 13 HOUR PREMEDICATION PREP.  1) Do not eat or drink anything after 6 am (4 hours prior to your test) 2) You have been given 2 bottles of oral contrast to drink. The solution may taste better if refrigerated, but do NOT add ice or any other liquid to this solution. Shake well before drinking.    Drink 1 bottle of contrast @ 8 am (2 hours prior to your exam)  Drink 1 bottle of contrast @ 9 am (1 hour prior to your exam)  You may take any medications as prescribed with a small amount of water, if necessary. If you take any of the following medications: METFORMIN, GLUCOPHAGE, GLUCOVANCE, AVANDAMET, RIOMET, FORTAMET, [place] MET, JANUMET, GLUMETZA or METAGLIP, you MAY be asked to HOLD this medication 48 hours AFTER the exam.  The  purpose of you drinking the oral contrast is to aid in the visualization of your intestinal tract. The contrast solution may cause some diarrhea. Depending on your individual set of symptoms, you may also receive an intravenous injection of x-ray contrast/dye. Plan on being at [facility] for 30 minutes or longer, depending on the type of exam you are having performed.  This test typically takes 30-45 minutes to complete.  If you have any questions regarding your exam or if you need to reschedule, you may call the CT department at [phone] between the hours of 8:00 am and 5:00 pm, Monday-Friday.  _______________________________________________________________  Alicia Ritter have been scheduled for a colonoscopy. Please follow written instructions given to you at your visit today.  Please pick up your prep supplies at the pharmacy within the next 1-3 days. If you use inhalers (even only as needed), please bring them with you on the day of your procedure.  Due to recent changes in healthcare laws, you may see the results of your imaging and laboratory studies on MyChart before your provider has had a chance to review them.  We understand that in some cases there may be results that are confusing or concerning to you. Not all laboratory results come back in the same time frame and the provider may be waiting for multiple results in order to interpret others.  Please give [place] 48 hours in order for  your provider to thoroughly review all the results before contacting the office for clarification of your results.

## 2020-09-03 NOTE — Progress Notes (Signed)
Agree with assessment and plan as outlined.  

## 2020-09-22 ENCOUNTER — Other Ambulatory Visit: Payer: Self-pay

## 2020-09-22 ENCOUNTER — Ambulatory Visit (INDEPENDENT_AMBULATORY_CARE_PROVIDER_SITE_OTHER)
Admission: RE | Admit: 2020-09-22 | Discharge: 2020-09-22 | Disposition: A | Discharge status: Home / Self Care | Payer: Federal, State, Local not specified - PPO | Source: Ambulatory Visit | Attending: Nurse Practitioner | Admitting: Nurse Practitioner

## 2020-09-22 ENCOUNTER — Telehealth: Payer: Self-pay | Admitting: *Deleted

## 2020-09-22 DIAGNOSIS — R1010 Upper abdominal pain, unspecified: Secondary | ICD-10-CM | POA: Diagnosis not present

## 2020-09-22 DIAGNOSIS — Z8719 Personal history of other diseases of the digestive system: Secondary | ICD-10-CM

## 2020-09-22 DIAGNOSIS — K5900 Constipation, unspecified: Secondary | ICD-10-CM | POA: Diagnosis not present

## 2020-09-22 MED ORDER — IOHEXOL 300 MG/ML  SOLN
100.0000 mL | Freq: Once | INTRAMUSCULAR | Status: AC | PRN
  Administered 2020-09-22: 100 mL via INTRAVENOUS

## 2020-09-22 NOTE — Telephone Encounter (Addendum)
Patient had a negative stress test and received cardiac clearance from Dr. Jeneen Montgomery office. Clearance form placed on Nevin Bloodgood, NP desk to review.

## 2020-10-21 ENCOUNTER — Encounter: Payer: Self-pay | Admitting: Gastroenterology

## 2020-10-21 ENCOUNTER — Telehealth: Payer: Self-pay | Admitting: Gastroenterology

## 2020-10-21 ENCOUNTER — Telehealth: Payer: Self-pay | Admitting: Nurse Practitioner

## 2020-10-21 NOTE — Telephone Encounter (Signed)
Okay; thanks.

## 2020-10-21 NOTE — Telephone Encounter (Signed)
Okay that is unfortunate. Please reschedule her at her earliest convenience. Thanks

## 2020-10-21 NOTE — Telephone Encounter (Signed)
Pt had to r/s her procedure for today to 12/15/20 at 9:00am. She needs another prep sent to [facility] on [address].

## 2020-10-21 NOTE — Telephone Encounter (Signed)
Hi Dr. Adela Lank, this pt called when the system was down to inform that she was not able to get out of her drive way due to being too icy so she needed to r/s her procedure for today. Cancellation was communicated to RN [place] in admitting. I was able to r/s pt just now to 12/15/20 at 9:00am. Thank you.

## 2020-10-24 MED ORDER — SUTAB 1479-225-188 MG PO TABS: 1.0000 | ORAL_TABLET | ORAL | 0 refills | Status: DC

## 2020-10-24 NOTE — Telephone Encounter (Signed)
Prep has been sent to patient's pharmacy

## 2020-12-10 ENCOUNTER — Telehealth: Payer: Self-pay | Admitting: Gastroenterology

## 2020-12-10 NOTE — Telephone Encounter (Signed)
Patient called requesting to reschedule her procedure for Monday 12/15/20 due to transportation. Is now on for 02/23/21.

## 2020-12-11 NOTE — Telephone Encounter (Signed)
Okay thanks for letting me know

## 2020-12-15 ENCOUNTER — Encounter: Payer: Federal, State, Local not specified - PPO | Admitting: Gastroenterology

## 2021-02-03 DIAGNOSIS — I10 Essential (primary) hypertension: Secondary | ICD-10-CM | POA: Insufficient documentation

## 2021-02-09 ENCOUNTER — Ambulatory Visit (AMBULATORY_SURGERY_CENTER): Payer: Self-pay

## 2021-02-09 ENCOUNTER — Other Ambulatory Visit: Payer: Self-pay

## 2021-02-09 VITALS — Ht 67.0 in | Wt 289.0 lb

## 2021-02-09 DIAGNOSIS — D649 Anemia, unspecified: Secondary | ICD-10-CM

## 2021-02-09 DIAGNOSIS — R1010 Upper abdominal pain, unspecified: Secondary | ICD-10-CM

## 2021-02-09 DIAGNOSIS — K5900 Constipation, unspecified: Secondary | ICD-10-CM

## 2021-02-09 MED ORDER — SUTAB 1479-225-188 MG PO TABS: 1.0000 | ORAL_TABLET | ORAL | 0 refills | Status: DC

## 2021-02-09 NOTE — Progress Notes (Signed)
No egg or soy allergy known to patient  No issues with past sedation with any surgeries or procedures Patient denies ever being told they had issues or difficulty with intubation  No FH of Malignant Hyperthermia No diet pills per patient No home 02 use per patient  No blood thinners per patient  Pt reports issues with constipation ---has been advised to have the 2 day prep No A fib or A flutter  EMMI video via MyChart  COVID 19 guidelines implemented in PV today with Pt and RN  Pt is fully vaccinated for Covid (J/J) Coupon given to pt in PV today, Code to Pharmacy and NO PA's for preps discussed with pt in PV today  Discussed with pt there will be an out-of-pocket cost for prep and that varies from $0 to 70 dollars  Due to the COVID-19 pandemic we are asking patients to follow certain guidelines.  Pt aware of COVID protocols and [facility] guidelines

## 2021-02-23 ENCOUNTER — Other Ambulatory Visit: Payer: Self-pay | Admitting: Gastroenterology

## 2021-02-23 ENCOUNTER — Ambulatory Visit (AMBULATORY_SURGERY_CENTER): Payer: Federal, State, Local not specified - PPO | Admitting: Gastroenterology

## 2021-02-23 ENCOUNTER — Encounter: Payer: Self-pay | Admitting: Gastroenterology

## 2021-02-23 ENCOUNTER — Other Ambulatory Visit: Payer: Self-pay

## 2021-02-23 VITALS — BP 126/72 | HR 58 | Temp 97.3°F | Resp 13 | Ht 67.0 in | Wt 289.0 lb

## 2021-02-23 DIAGNOSIS — K648 Other hemorrhoids: Secondary | ICD-10-CM

## 2021-02-23 DIAGNOSIS — K5730 Diverticulosis of large intestine without perforation or abscess without bleeding: Secondary | ICD-10-CM

## 2021-02-23 DIAGNOSIS — K319 Disease of stomach and duodenum, unspecified: Secondary | ICD-10-CM | POA: Diagnosis not present

## 2021-02-23 DIAGNOSIS — K2950 Unspecified chronic gastritis without bleeding: Secondary | ICD-10-CM

## 2021-02-23 DIAGNOSIS — K2970 Gastritis, unspecified, without bleeding: Secondary | ICD-10-CM | POA: Diagnosis not present

## 2021-02-23 DIAGNOSIS — K635 Polyp of colon: Secondary | ICD-10-CM

## 2021-02-23 DIAGNOSIS — D125 Benign neoplasm of sigmoid colon: Secondary | ICD-10-CM

## 2021-02-23 DIAGNOSIS — E611 Iron deficiency: Secondary | ICD-10-CM

## 2021-02-23 DIAGNOSIS — R1010 Upper abdominal pain, unspecified: Secondary | ICD-10-CM

## 2021-02-23 MED ORDER — SODIUM CHLORIDE 0.9 % IV SOLN: 500.0000 mL | Freq: Once | INTRAVENOUS | Status: DC

## 2021-02-23 MED ORDER — OMEPRAZOLE 20 MG PO CPDR: 20.0000 mg | DELAYED_RELEASE_CAPSULE | Freq: Every day | ORAL | 3 refills | Status: AC

## 2021-02-23 NOTE — Patient Instructions (Signed)
YOU HAD AN ENDOSCOPIC PROCEDURE TODAY AT THE [facility]:   Refer to the procedure report that was given to you for any specific questions about what was found during the examination.  If the procedure report does not answer your questions, please call your gastroenterologist to clarify.  If you requested that your care partner not be given the details of your procedure findings, then the procedure report has been included in a sealed envelope for you to review at your convenience later.  YOU SHOULD EXPECT: Some feelings of bloating in the abdomen. Passage of more gas than usual.  Walking can help get rid of the air that was put into your GI tract during the procedure and reduce the bloating. If you had a lower endoscopy (such as a colonoscopy or flexible sigmoidoscopy) you may notice spotting of blood in your stool or on the toilet paper. If you underwent a bowel prep for your procedure, you may not have a normal bowel movement for a few days.  Please Note:  You might notice some irritation and congestion in your nose or some drainage.  This is from the oxygen used during your procedure.  There is no need for concern and it should clear up in a day or so.  SYMPTOMS TO REPORT IMMEDIATELY:   Following lower endoscopy (colonoscopy or flexible sigmoidoscopy):  Excessive amounts of blood in the stool  Significant tenderness or worsening of abdominal pains  Swelling of the abdomen that is new, acute  Fever of 100F or higher   Following upper endoscopy (EGD)  Vomiting of blood or coffee ground material  New chest pain or pain under the shoulder blades  Painful or persistently difficult swallowing  New shortness of breath  Fever of 100F or higher  Black, tarry-looking stools  For urgent or emergent issues, a gastroenterologist can be reached at any hour by calling [phone]. Do not use MyChart messaging for urgent concerns.    DIET:  We do recommend a small meal at first, but  then you may proceed to your regular diet.  Drink plenty of fluids but you should avoid alcoholic beverages for 24 hours.  ACTIVITY:  You should plan to take it easy for the rest of today and you should NOT DRIVE or use heavy machinery until tomorrow (because of the sedation medicines used during the test).    FOLLOW UP: Our staff will call the number listed on your records 48-72 hours following your procedure to check on you and address any questions or concerns that you may have regarding the information given to you following your procedure. If we do not reach you, we will leave a message.  We will attempt to reach you two times.  During this call, we will ask if you have developed any symptoms of COVID 19. If you develop any symptoms (ie: fever, flu-like symptoms, shortness of breath, cough etc.) before then, please call [phone].  If you test positive for Covid 19 in the 2 weeks post procedure, please call and report this information to us.    If any biopsies were taken you will be contacted by phone or by letter within the next 1-3 weeks.  Please call us at [phone] if you have not heard about the biopsies in 3 weeks.    SIGNATURES/CONFIDENTIALITY: You and/or your care partner have signed paperwork which will be entered into your electronic medical record.  These signatures attest to the fact that that the information above on   your After Visit Summary has been reviewed and is understood.  Full responsibility of the confidentiality of this discharge information lies with you and/or your care-partner. 

## 2021-02-23 NOTE — Op Note (Signed)
[facility] Patient Name: Alicia Ritter Procedure Date: 02/23/2021 8:23 AM MRN: [id number] Endoscopist: Remo Lipps P. Havery Moros , MD Age: 55 Referring MD:  Date of Birth: [birth year] Gender: Female Account #: [id number] Procedure:                Upper GI endoscopy Indications:              Iron deficiency, history of epigastric / pain, CT                            negative in December, symptoms have since improved Medicines:                Monitored Anesthesia Care Procedure:                Pre-Anesthesia Assessment:                           - Prior to the procedure, a History and Physical                            was performed, and patient medications and                            allergies were reviewed. The patient's tolerance of                            previous anesthesia was also reviewed. The risks                            and benefits of the procedure and the sedation                            options and risks were discussed with the patient.                            All questions were answered, and informed consent                            was obtained. Prior Anticoagulants: The patient has                            taken no previous anticoagulant or antiplatelet                            agents. ASA Grade Assessment: II - A patient with                            mild systemic disease. After reviewing the risks                            and benefits, the patient was deemed in                            satisfactory condition to undergo the procedure.  After obtaining informed consent, the endoscope was                            passed under direct vision. Throughout the                            procedure, the patient's blood pressure, pulse, and                            oxygen saturations were monitored continuously. The                            Endoscope was introduced through the mouth, and                             advanced to the second part of duodenum. The upper                            GI endoscopy was accomplished without difficulty.                            The patient tolerated the procedure well. Scope In: Scope Out: Findings:                 Esophagogastric landmarks were identified: the                            Z-line was found at 38 cm, the gastroesophageal                            junction was found at 38 cm and the upper extent of                            the gastric folds was found at 40 cm from the                            incisors.                           A 2 cm hiatal hernia was present.                           LA Grade esophagitis was found 38 cm from the                            incisors, with some associated inflammatory                            nodularity. Biopsies were taken with a cold forceps                            for histology.                           The exam  of the esophagus was otherwise normal.                           Diffuse mildly erythematous mucosa was found in the                            gastric antrum.                           The exam of the stomach was otherwise normal.                           Biopsies were taken with a cold forceps in the                            gastric body, at the incisura and in the gastric                            antrum for Helicobacter pylori testing.                           The duodenal bulb and second portion of the                            duodenum were normal. Complications:            No immediate complications. Estimated blood loss:                            Minimal. Estimated Blood Loss:     Estimated blood loss was minimal. Impression:               - Esophagogastric landmarks identified.                           - 2 cm hiatal hernia.                           - LA Grade A reflux esophagitis with slight                            nodularity, suspect inflammatory in nature.                             Biopsied.                           - Erythematous mucosa in the antrum.                           - Normal stomach otherwise - biopsies taken to rule                            out H pylori                           - Normal duodenal bulb  and second portion of the                            duodenum.                           No overt cause for iron deficiency on EGD and                            colonoscopy, could be due to menstrual blood loss.                            Would monitor Hgb on iron supplementation Recommendation:           - Patient has a contact number available for                            emergencies. The signs and symptoms of potential                            delayed complications were discussed with the                            patient. Return to normal activities tomorrow.                            Written discharge instructions were provided to the                            patient.                           - Resume previous diet.                           - Continue present medications.                           - Await pathology results.                           - Start omeprazole 20mg  / day for 6 weeks Delrico Minehart P. Helaine Yackel, MD 02/23/2021 9:27:15 AM This report has been signed electronically.

## 2021-02-23 NOTE — Progress Notes (Signed)
Pt's states no medical or surgical changes since previsit or office visit. 

## 2021-02-23 NOTE — Progress Notes (Signed)
Called to room to assist during endoscopic procedure.  Patient ID and intended procedure confirmed with present staff. Received instructions for my participation in the procedure from the performing physician.  

## 2021-02-23 NOTE — Op Note (Signed)
[facility] Patient Name: Alicia Ritter Procedure Date: 02/23/2021 8:17 AM MRN: [id number] Endoscopist: Remo Lipps P. Havery Moros , MD Age: 55 Referring MD:  Date of Birth: [birth year] Gender: Female Account #: [id number] Procedure:                Colonoscopy Indications:              Iron deficiency, first colonoscopy / screening Medicines:                Monitored Anesthesia Care Procedure:                Pre-Anesthesia Assessment:                           - Prior to the procedure, a History and Physical                            was performed, and patient medications and                            allergies were reviewed. The patient's tolerance of                            previous anesthesia was also reviewed. The risks                            and benefits of the procedure and the sedation                            options and risks were discussed with the patient.                            All questions were answered, and informed consent                            was obtained. Prior Anticoagulants: The patient has                            taken no previous anticoagulant or antiplatelet                            agents. ASA Grade Assessment: II - A patient with                            mild systemic disease. After reviewing the risks                            and benefits, the patient was deemed in                            satisfactory condition to undergo the procedure.                           After obtaining informed consent, the colonoscope  was passed under direct vision. Throughout the                            procedure, the patient's blood pressure, pulse, and                            oxygen saturations were monitored continuously. The                            Olympus [id number]-[id number] (#[id number]) Colonoscope was                            introduced through the anus and advanced to the the                            cecum,  identified by appendiceal orifice and                            ileocecal valve. The colonoscopy was performed                            without difficulty. The patient tolerated the                            procedure well. The quality of the bowel                            preparation was adequate. The ileocecal valve,                            appendiceal orifice, and rectum were photographed. Scope In: 8:49:26 AM Scope Out: 9:15:17 AM Scope Withdrawal Time: 0 hours 20 minutes 20 seconds  Total Procedure Duration: 0 hours 25 minutes 51 seconds  Findings:                 The perianal and digital rectal examinations were                            normal.                           A large amount of liquid stool was found in the                            entire colon, making visualization difficult.                            Several minutes were spent lavaging the colon using                            copious amounts of sterile water, resulting in                            clearance with adequate visualization.  Multiple small-mouthed diverticula were found in                            the transverse colon and left colon.                           Three sessile polyps were found in the sigmoid                            colon. The polyps were 3 to 4 mm in size. These                            polyps were removed with a cold snare. Resection                            and retrieval were complete.                           Internal hemorrhoids were found during                            retroflexion. The hemorrhoids were small.                           The exam was otherwise without abnormality. Complications:            No immediate complications. Estimated blood loss:                            Minimal. Estimated Blood Loss:     Estimated blood loss was minimal. Impression:               - Stool in the entire examined colon, able to be                             lavaged to complete the exam.                           - Diverticulosis in the transverse colon and in the                            left colon.                           - Three 3 to 4 mm polyps in the sigmoid colon,                            removed with a cold snare. Resected and retrieved.                           - Internal hemorrhoids.                           - The examination was otherwise normal.  No cause for iron deficiency on this exam Recommendation:           - Patient has a contact number available for                            emergencies. The signs and symptoms of potential                            delayed complications were discussed with the                            patient. Return to normal activities tomorrow.                            Written discharge instructions were provided to the                            patient.                           - Resume previous diet.                           - Continue present medications.                           - Await pathology results. Remo Lipps P. Melat Wrisley, MD 02/23/2021 9:21:29 AM This report has been signed electronically.

## 2021-02-23 NOTE — Progress Notes (Signed)
Pt Drowsy. VSS. To PACU, report to RN. No anesthetic complications noted.  

## 2021-02-25 ENCOUNTER — Telehealth: Payer: Self-pay | Admitting: *Deleted

## 2021-02-25 ENCOUNTER — Telehealth: Payer: Self-pay

## 2021-02-25 NOTE — Telephone Encounter (Signed)
NO ANSWER, MESSAGE LEFT FOR PATIENT. 

## 2021-02-25 NOTE — Telephone Encounter (Signed)
    Follow up Call-  Call back number 02/23/2021  Post procedure Call Back phone  # [phone]  Permission to leave phone message Yes    Spoke to pt's daughter who states pt was at work. States her mom is doing well as she was able to go to work this morning. States she had some bloating but overall was feeling well.    Patient questions:  Do you have a fever, pain , or abdominal swelling? No. Pain Score  0 *  Have you tolerated food without any problems? Yes.    Have you been able to return to your normal activities? Yes.    Do you have any questions about your discharge instructions: Diet   No. Medications  No. Follow up visit  No.  Do you have questions or concerns about your Care? No.  Actions: * If pain score is 4 or above: No action needed, pain <4.  1. Have you developed a fever since your procedure? no  2.   Have you had an respiratory symptoms (SOB or cough) since your procedure? no  3.   Have you tested positive for COVID 19 since your procedure no  4.   Have you had any family members/close contacts diagnosed with the COVID 19 since your procedure?  no   If yes to any of these questions please route to Joylene John, RN and Joella Prince, RN

## 2021-03-09 DIAGNOSIS — K760 Fatty (change of) liver, not elsewhere classified: Secondary | ICD-10-CM | POA: Insufficient documentation

## 2021-03-11 ENCOUNTER — Other Ambulatory Visit: Payer: Self-pay

## 2021-03-11 DIAGNOSIS — R1010 Upper abdominal pain, unspecified: Secondary | ICD-10-CM

## 2021-03-11 DIAGNOSIS — E611 Iron deficiency: Secondary | ICD-10-CM

## 2021-03-11 DIAGNOSIS — K2970 Gastritis, unspecified, without bleeding: Secondary | ICD-10-CM

## 2021-09-24 DIAGNOSIS — Z87898 Personal history of other specified conditions: Secondary | ICD-10-CM | POA: Insufficient documentation

## 2021-11-12 IMAGING — CT CT ABD-PELV W/ CM
2 of 5 series · 16 of 46 positions shown, 18 images · IV contrast (Omni 300)
Comparison: You have

CLINICAL DATA: Upper abdominal pain and nausea for 2 days.

EXAM:
CT ABDOMEN AND PELVIS WITH CONTRAST
TECHNIQUE: Multidetector CT imaging of the abdomen and pelvis was performed
using the standard protocol following bolus administration of
intravenous contrast.
CONTRAST:  100mL OMNIPAQUE IOHEXOL 300 MG/ML  SOLN

[Series 3: a/p w/ 5mm · axial · 0.98mm/px · z∈[+740,+1190]mm · 13 of 100 slices shown, 15 images]
[im 5/100  soft-tissue]
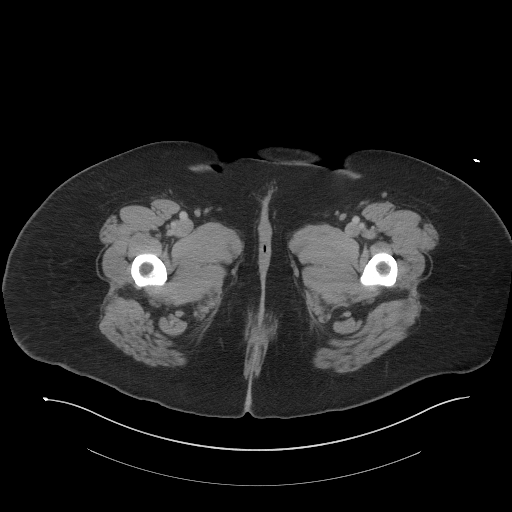
[im 5/100  bone]
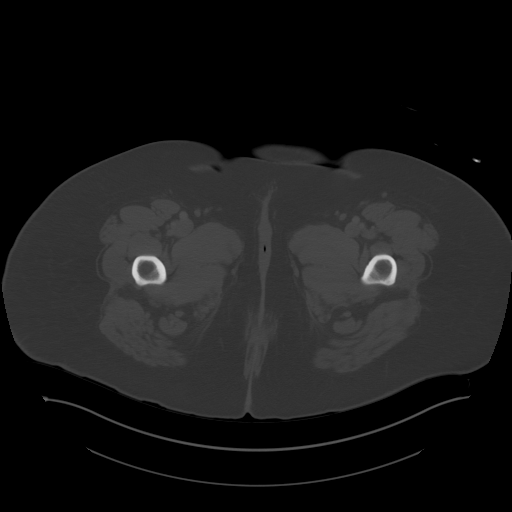
[im 15/100  soft-tissue]
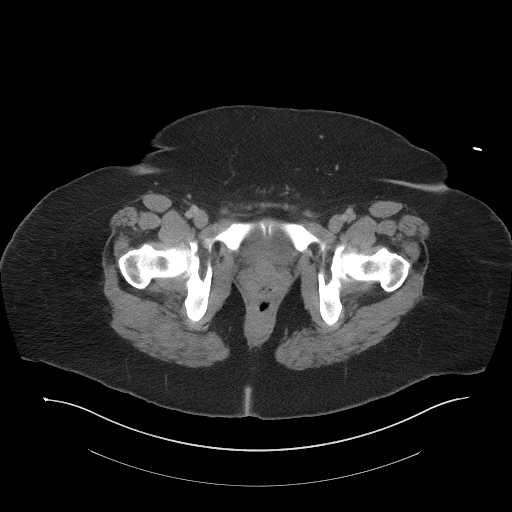
[im 20/100  soft-tissue]
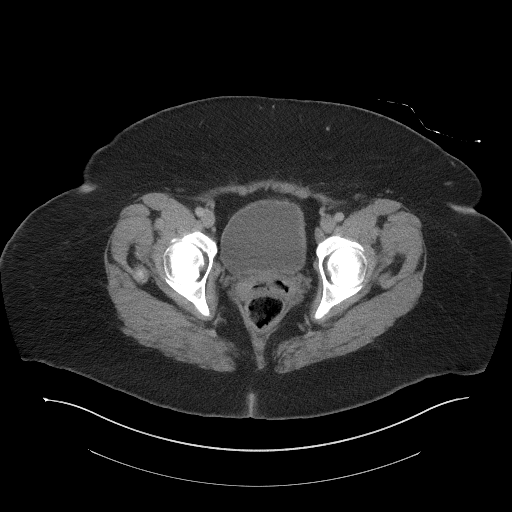
[im 30/100  soft-tissue]
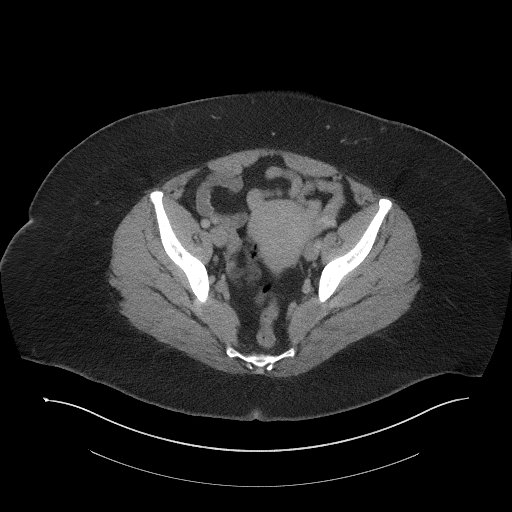
[im 35/100  soft-tissue]
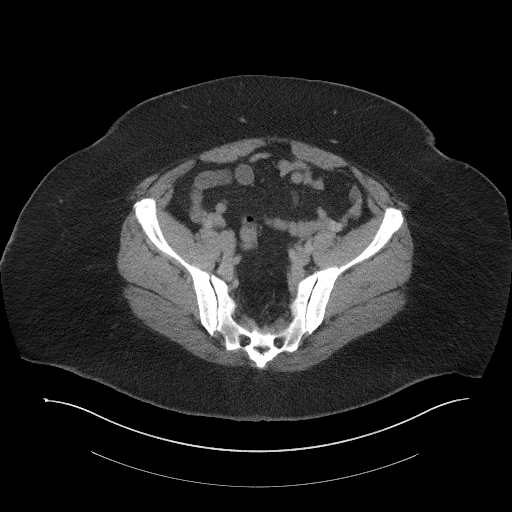
[im 45/100  soft-tissue]
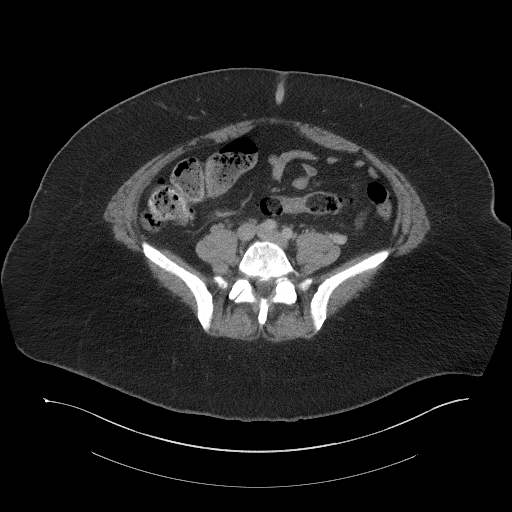
[im 50/100  soft-tissue]
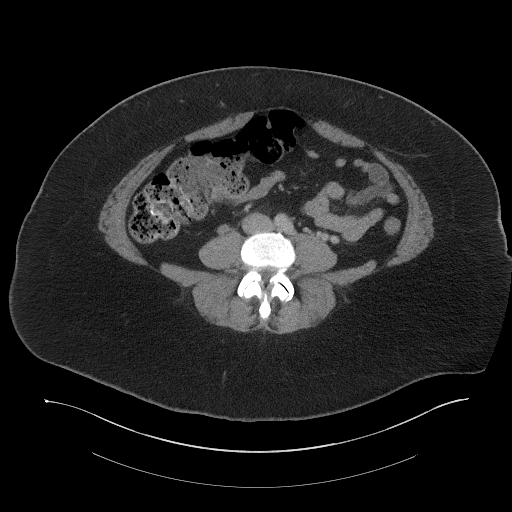
[im 55/100  soft-tissue]
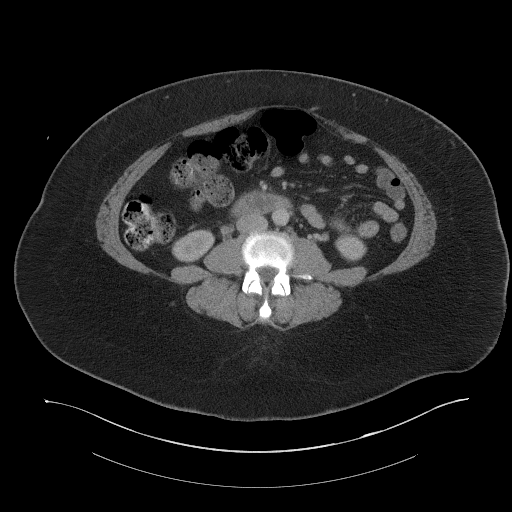
[im 65/100  soft-tissue]
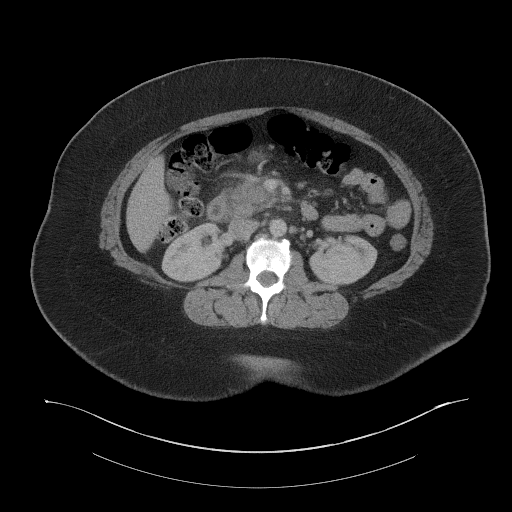
[im 65/100  bone]
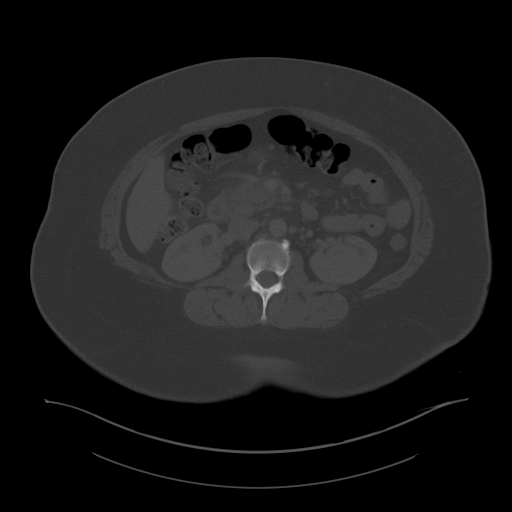
[im 70/100  soft-tissue]
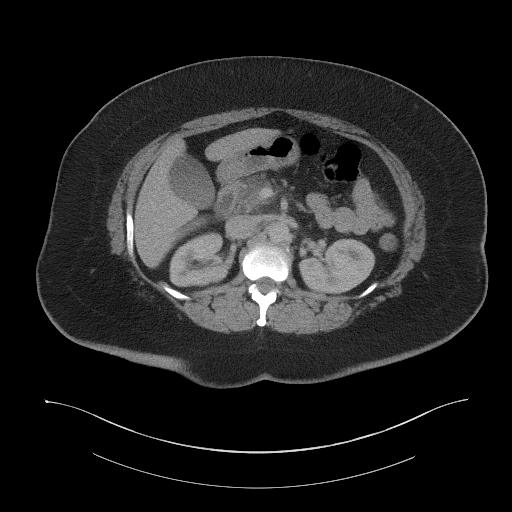
[im 80/100  soft-tissue]
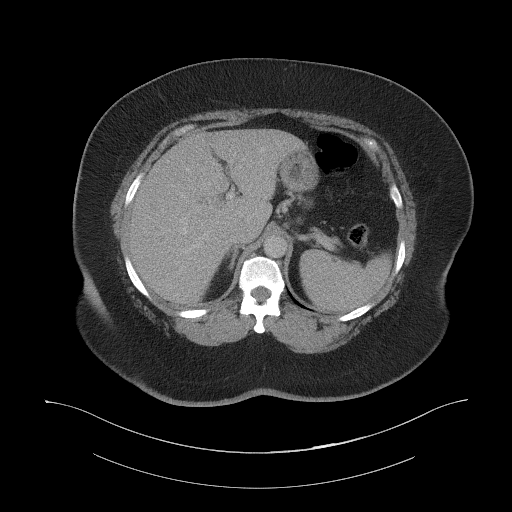
[im 85/100  soft-tissue]
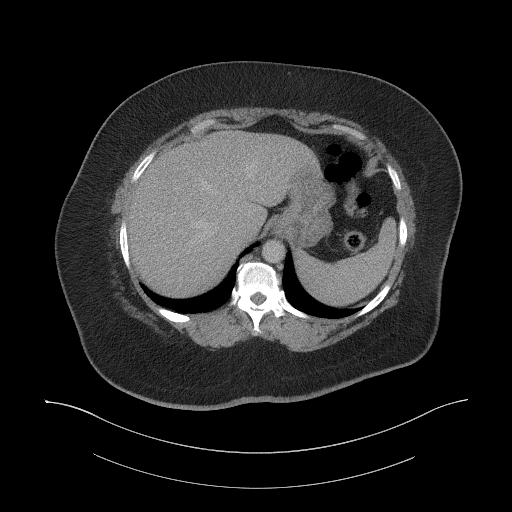
[im 95/100  soft-tissue]
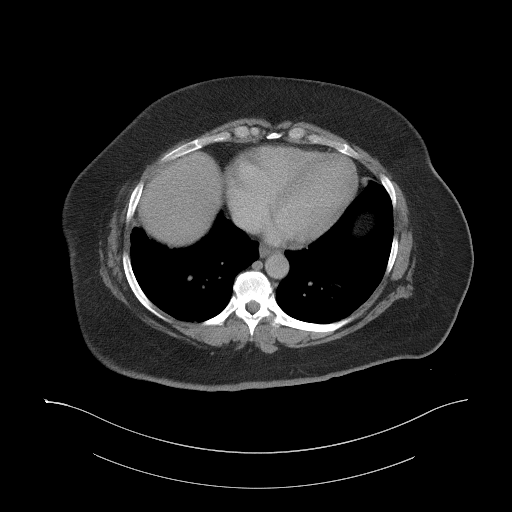

[Series 6: a/p w/ cor · coronal · 0.97mm/px · 3 of 178 slices shown]
[im 60/178  soft-tissue]
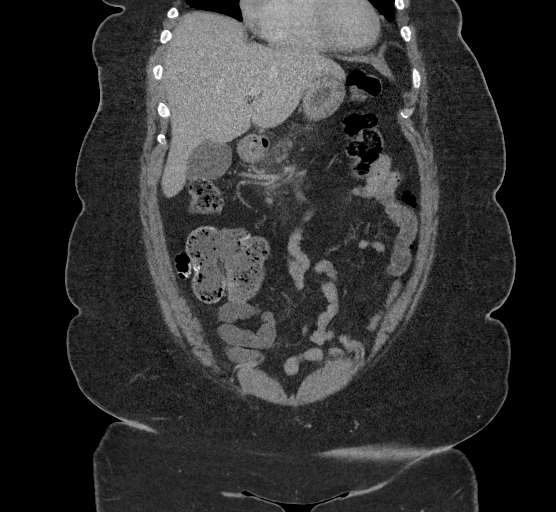
[im 79/178  soft-tissue]
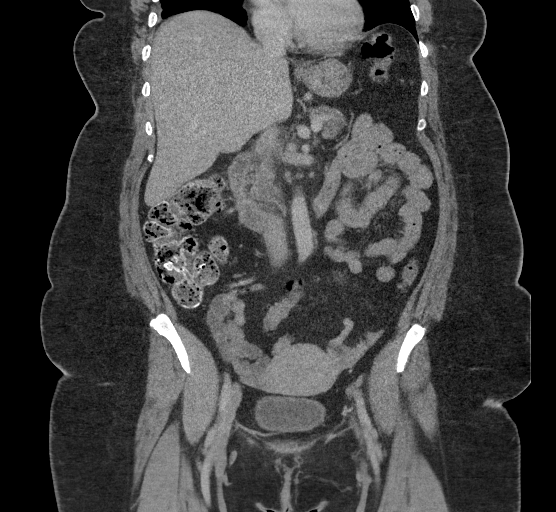
[im 99/178  soft-tissue]
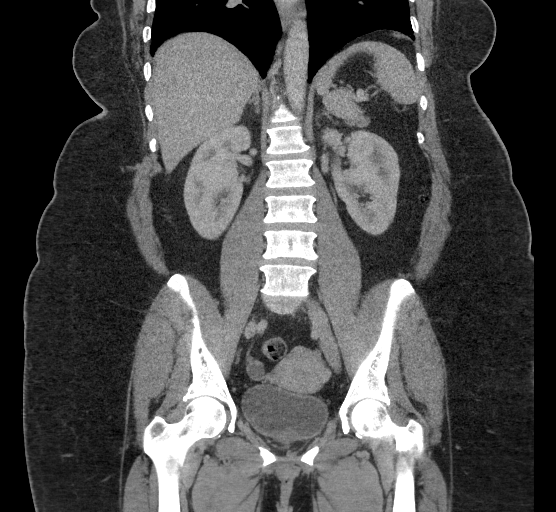

[16 of 46 positions shown; findings below may reference images not displayed]

FINDINGS: Lower chest: Minimal atelectasis is present at the lung bases. The
heart size is normal. No significant pleural or pericardial effusion
is present.

Hepatobiliary: Mild diffuse fatty infiltration of the liver is
present. No discrete lesions are present. Common bile duct and
gallbladder are normal.

Pancreas: Inflammatory changes are present about the pancreatic head
and uncinate process. The distal body and tail are normal. No mass
lesion or fluid collection is present.

Spleen: Normal in size without focal abnormality.

Adrenals/Urinary Tract: The adrenal glands are normal bilaterally.
Kidneys and ureters are within normal limits. The urinary bladder is
within normal limits.

Stomach/Bowel: The stomach is within normal limits. Portions the
duodenum demonstrate inflammatory changes, likely secondary to the
pancreatic inflammation. Small bowel is unremarkable. Terminal ileum
is normal. The appendix is visualized and normal. The ascending and
transverse colon are normal. Diverticular changes are present in the
descending and sigmoid colon without focal inflammation to suggest
diverticulitis.

Vascular/Lymphatic: No significant vascular findings are present. No
enlarged abdominal or pelvic lymph nodes.

Reproductive: Uterus and bilateral adnexa are unremarkable.

Other: No abdominal wall hernia or abnormality. No abdominopelvic
ascites.

Musculoskeletal: Degenerative facet changes are greatest at L4-5 and
worse on the left at L5-S1. Vertebral body heights are maintained.
No focal lytic or blastic lesions are present. The bony pelvis is
normal. Hips are located and within normal limits.
IMPRESSION: 1. Inflammatory changes about the pancreatic head and uncinate
process compatible with acute pancreatitis. No complicating features
are present.
2. Inflammatory changes of the duodenum are likely secondary to the
pancreatic inflammation.
3. Hepatic steatosis.
4. Descending and sigmoid diverticulosis without diverticulitis.
5. Degenerative facet changes in the lower lumbar spine.

## 2021-12-02 IMAGING — US US ABDOMEN LIMITED
1 series · 14 of 25 positions shown · non-contrast
Comparison: CT abdomen 01/25/2020

CLINICAL DATA: Pancreatitis

EXAM:
ULTRASOUND ABDOMEN LIMITED RIGHT UPPER QUADRANT

[Series 1: us abdomen limited · 14 of 46 slices shown]
[im 1/46]
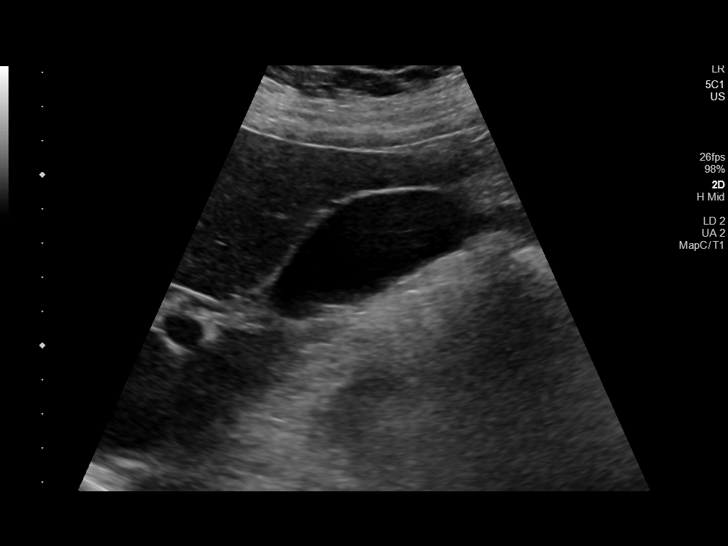
[im 4/46]
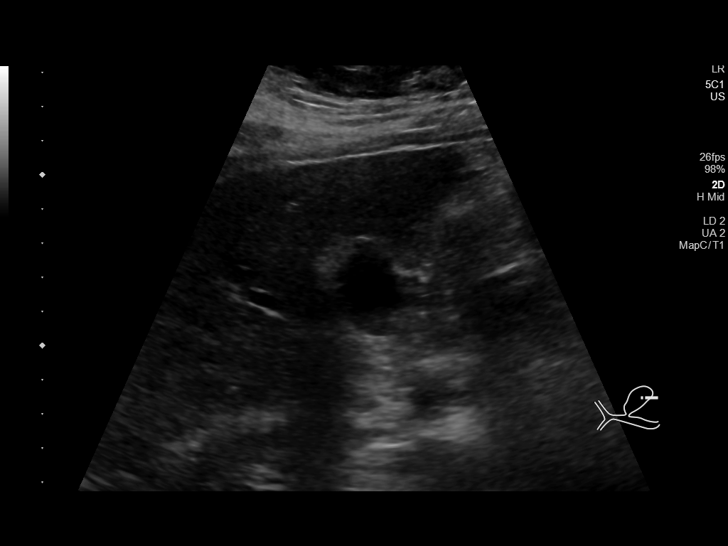
[im 8/46]
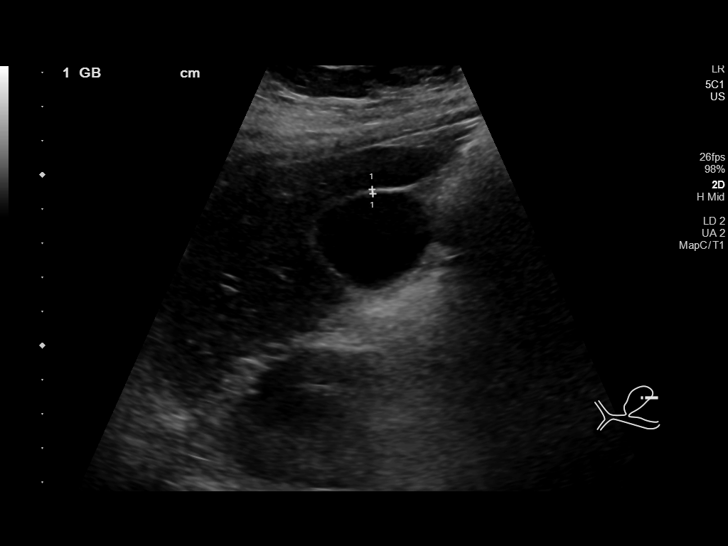
[im 12/46]
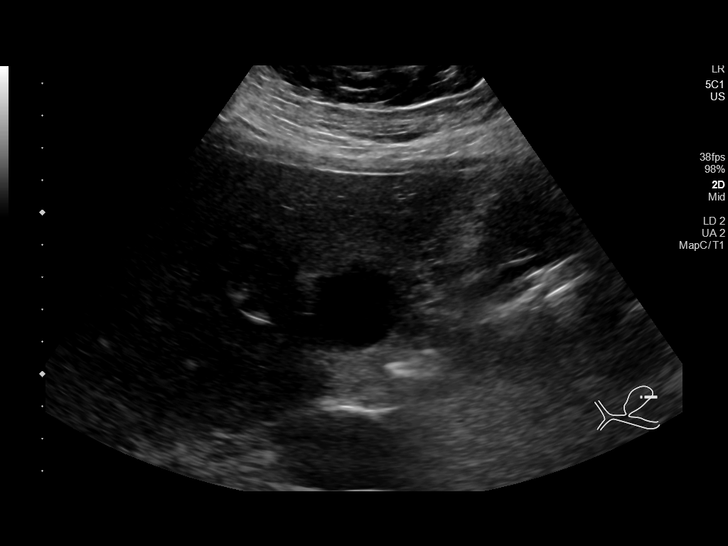
[im 16/46]
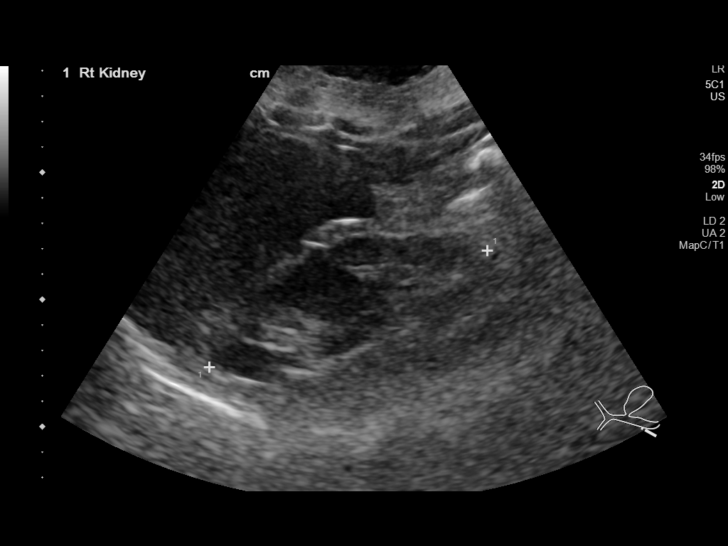
[im 17/46]
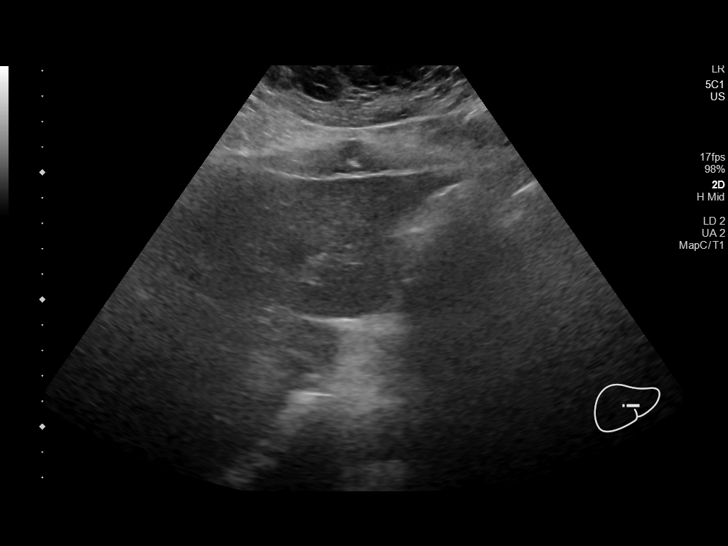
[im 21/46]
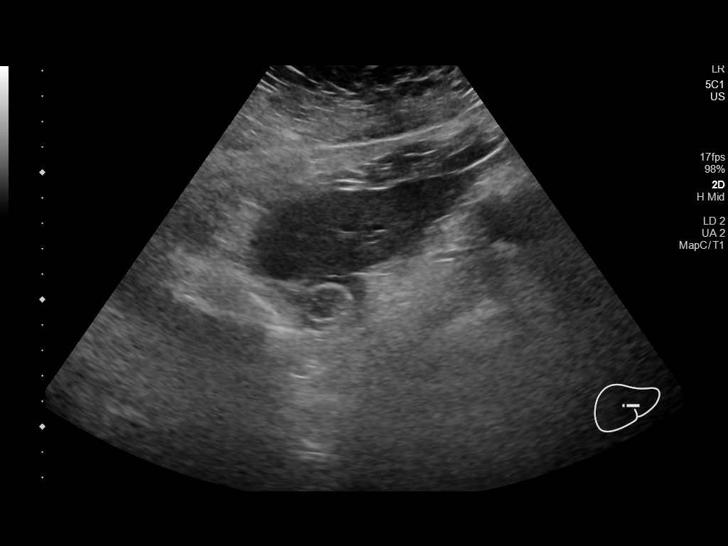
[im 25/46]
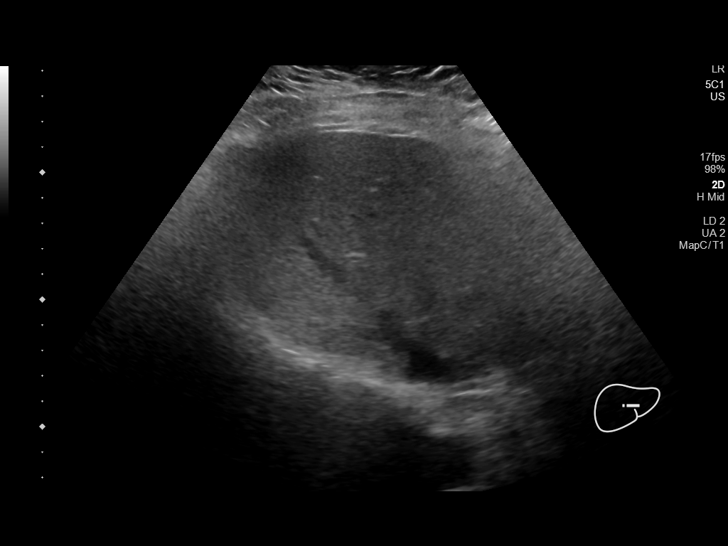
[im 29/46]
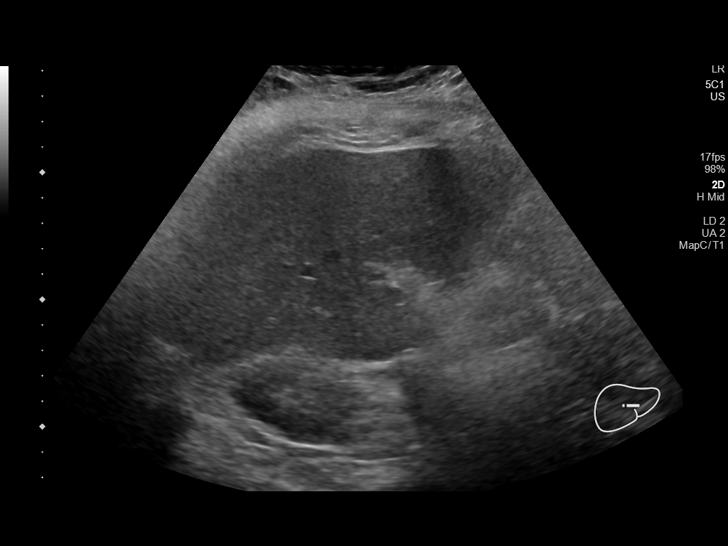
[im 31/46]
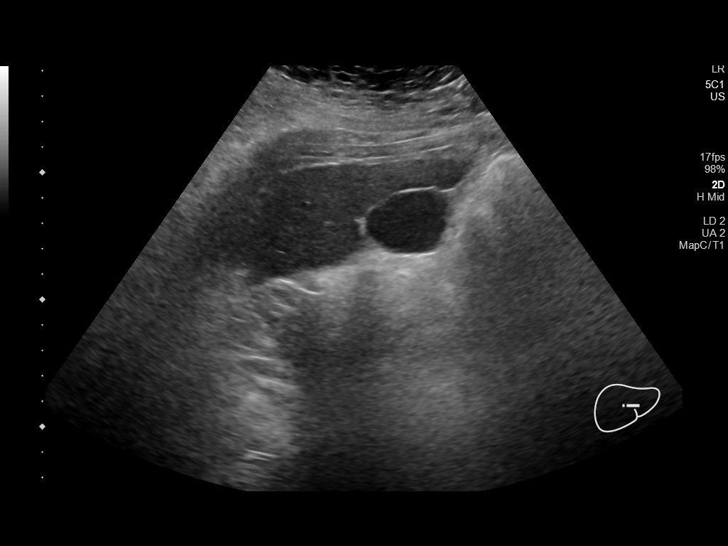
[im 34/46]
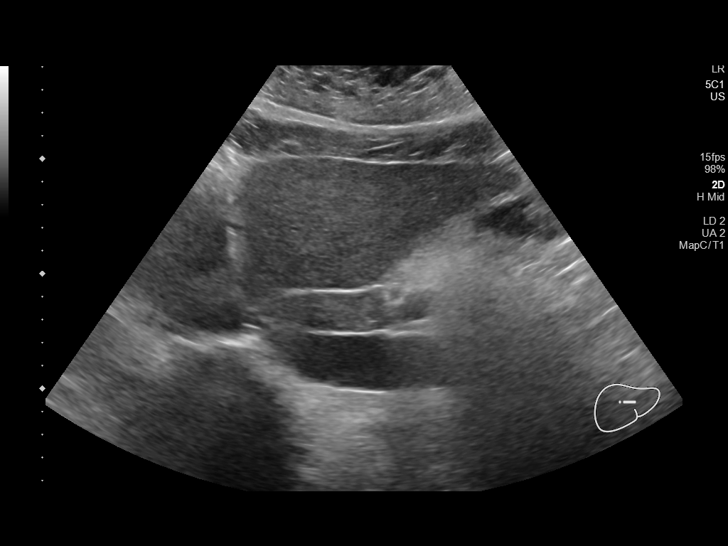
[im 38/46]
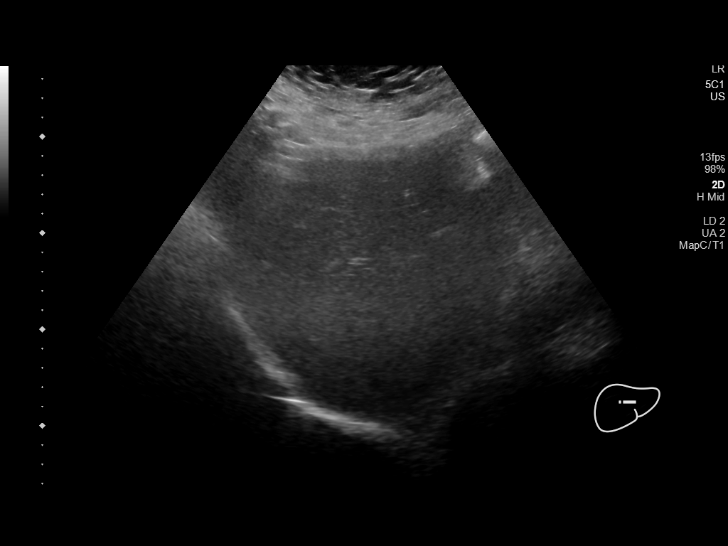
[im 42/46]
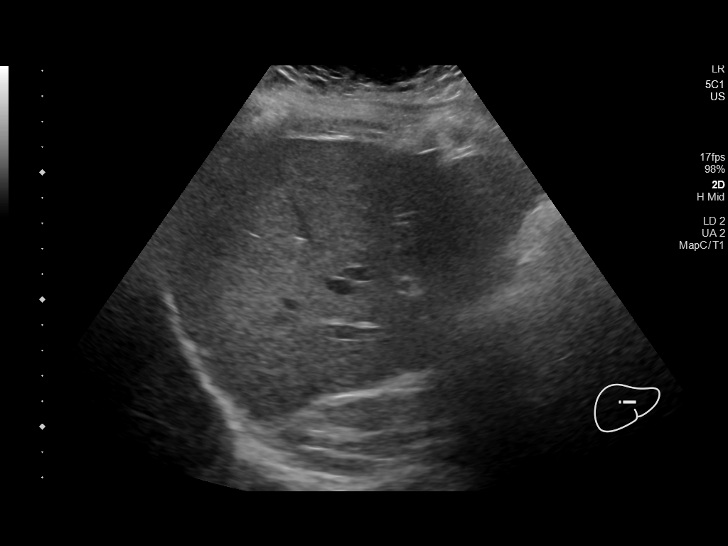
[im 46/46]
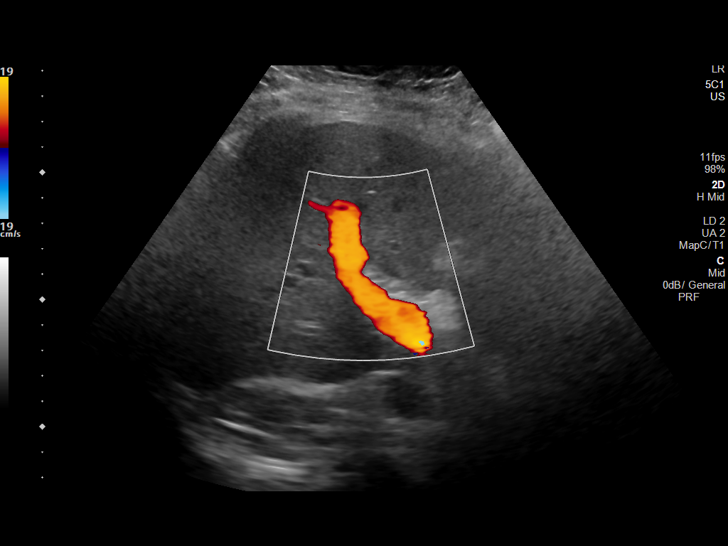

[14 of 25 positions shown; findings below may reference images not displayed]

FINDINGS: Gallbladder:

No gallstones or wall thickening visualized. No sonographic Murphy
sign noted by sonographer.

Common bile duct:

Diameter: 0.3 cm

Liver:

No focal lesion identified. Within normal limits in parenchymal
echogenicity. Portal vein is patent on color Doppler imaging with
normal direction of blood flow towards the liver.

Other: None.
IMPRESSION: Normal hepatobiliary ultrasound.

## 2022-02-08 ENCOUNTER — Inpatient Hospital Stay (HOSPITAL_COMMUNITY): Payer: Federal, State, Local not specified - PPO

## 2022-02-08 ENCOUNTER — Emergency Department (HOSPITAL_COMMUNITY): Payer: Federal, State, Local not specified - PPO

## 2022-02-08 ENCOUNTER — Inpatient Hospital Stay (HOSPITAL_COMMUNITY)
Admission: EM | Admit: 2022-02-08 | Discharge: 2022-02-11 | DRG: 439 | Disposition: A | Discharge status: Home / Self Care | Payer: Federal, State, Local not specified - PPO | Attending: Family Medicine | Admitting: Family Medicine

## 2022-02-08 ENCOUNTER — Encounter (HOSPITAL_COMMUNITY): Payer: Self-pay | Admitting: Emergency Medicine

## 2022-02-08 ENCOUNTER — Other Ambulatory Visit: Payer: Self-pay

## 2022-02-08 DIAGNOSIS — K861 Other chronic pancreatitis: Principal | ICD-10-CM | POA: Diagnosis present

## 2022-02-08 DIAGNOSIS — J45909 Unspecified asthma, uncomplicated: Secondary | ICD-10-CM | POA: Diagnosis present

## 2022-02-08 DIAGNOSIS — K859 Acute pancreatitis without necrosis or infection, unspecified: Secondary | ICD-10-CM | POA: Diagnosis present

## 2022-02-08 DIAGNOSIS — K858 Other acute pancreatitis without necrosis or infection: Secondary | ICD-10-CM | POA: Diagnosis present

## 2022-02-08 DIAGNOSIS — I959 Hypotension, unspecified: Secondary | ICD-10-CM | POA: Diagnosis not present

## 2022-02-08 DIAGNOSIS — Z8719 Personal history of other diseases of the digestive system: Secondary | ICD-10-CM

## 2022-02-08 DIAGNOSIS — E781 Pure hyperglyceridemia: Secondary | ICD-10-CM | POA: Diagnosis present

## 2022-02-08 DIAGNOSIS — Z6841 Body Mass Index (BMI) 40.0 and over, adult: Secondary | ICD-10-CM

## 2022-02-08 DIAGNOSIS — I471 Supraventricular tachycardia: Secondary | ICD-10-CM | POA: Diagnosis present

## 2022-02-08 DIAGNOSIS — N951 Menopausal and female climacteric states: Secondary | ICD-10-CM | POA: Diagnosis present

## 2022-02-08 DIAGNOSIS — I10 Essential (primary) hypertension: Secondary | ICD-10-CM | POA: Diagnosis present

## 2022-02-08 DIAGNOSIS — E785 Hyperlipidemia, unspecified: Secondary | ICD-10-CM | POA: Diagnosis present

## 2022-02-08 DIAGNOSIS — Z79899 Other long term (current) drug therapy: Secondary | ICD-10-CM

## 2022-02-08 DIAGNOSIS — K8500 Idiopathic acute pancreatitis without necrosis or infection: Secondary | ICD-10-CM | POA: Diagnosis not present

## 2022-02-08 DIAGNOSIS — Z8249 Family history of ischemic heart disease and other diseases of the circulatory system: Secondary | ICD-10-CM

## 2022-02-08 DIAGNOSIS — D509 Iron deficiency anemia, unspecified: Secondary | ICD-10-CM | POA: Diagnosis present

## 2022-02-08 LAB — URINALYSIS, ROUTINE W REFLEX MICROSCOPIC
Bilirubin Urine: NEGATIVE
Glucose, UA: NEGATIVE mg/dL
Hgb urine dipstick: NEGATIVE
Ketones, ur: 5 mg/dL — AB
Leukocytes,Ua: NEGATIVE
Nitrite: NEGATIVE
Protein, ur: NEGATIVE mg/dL
Specific Gravity, Urine: 1.012 (ref 1.005–1.030)
pH: 6 (ref 5.0–8.0)

## 2022-02-08 LAB — COMPREHENSIVE METABOLIC PANEL
ALT: 17 U/L (ref 0–44)
AST: 19 U/L (ref 15–41)
Albumin: 3.5 g/dL (ref 3.5–5.0)
Alkaline Phosphatase: 69 U/L (ref 38–126)
Anion gap: 10 (ref 5–15)
BUN: 7 mg/dL (ref 6–20)
CO2: 25 mmol/L (ref 22–32)
Calcium: 9.1 mg/dL (ref 8.9–10.3)
Chloride: 101 mmol/L (ref 98–111)
Creatinine, Ser: 0.74 mg/dL (ref 0.44–1.00)
GFR, Estimated: >60 mL/min (ref >60)
Glucose, Bld: 125 mg/dL — ABNORMAL HIGH (ref 70–99)
Potassium: 4 mmol/L (ref 3.5–5.1)
Sodium: 136 mmol/L (ref 135–145)
Total Bilirubin: 0.9 mg/dL (ref 0.3–1.2)
Total Protein: 7.5 g/dL (ref 6.5–8.1)

## 2022-02-08 LAB — CBC
HCT: 37.5 % (ref 36.0–46.0)
Hemoglobin: 12.7 g/dL (ref 12.0–15.0)
MCH: 28.7 pg (ref 26.0–34.0)
MCHC: 33.9 g/dL (ref 30.0–36.0)
MCV: 84.8 fL (ref 80.0–100.0)
Platelets: 323 10*3/uL (ref 150–400)
RBC: 4.42 MIL/uL (ref 3.87–5.11)
RDW: 13.1 % (ref 11.5–15.5)
WBC: 10.9 10*3/uL — ABNORMAL HIGH (ref 4.0–10.5)
nRBC: 0 % (ref 0.0–0.2)

## 2022-02-08 LAB — LIPID PANEL
Cholesterol: 194 mg/dL (ref 0–200)
HDL: 76 mg/dL (ref >40)
LDL Cholesterol: 108 mg/dL — ABNORMAL HIGH (ref 0–99)
Total CHOL/HDL Ratio: 2.6 RATIO
Triglycerides: 48 mg/dL (ref <150)
VLDL: 10 mg/dL (ref 0–40)

## 2022-02-08 LAB — I-STAT BETA HCG BLOOD, ED (MC, WL, AP ONLY): I-stat hCG, quantitative: <5 m[IU]/mL (ref <5)

## 2022-02-08 LAB — LIPASE, BLOOD: Lipase: 170 U/L — ABNORMAL HIGH (ref 11–51)

## 2022-02-08 MED ORDER — HYDROMORPHONE HCL 1 MG/ML IJ SOLN
1.0000 mg | Freq: Once | INTRAMUSCULAR | Status: AC
  Administered 2022-02-08: 1 mg via INTRAVENOUS
  Filled 2022-02-08: qty 1

## 2022-02-08 MED ORDER — AZELASTINE HCL 0.1 % NA SOLN
1.0000 | Freq: Every day | NASAL | Status: DC
  Administered 2022-02-08 – 2022-02-11 (×4): 1 via NASAL
  Filled 2022-02-08: qty 30

## 2022-02-08 MED ORDER — ENOXAPARIN SODIUM 40 MG/0.4ML IJ SOSY
40.0000 mg | PREFILLED_SYRINGE | INTRAMUSCULAR | Status: DC
  Administered 2022-02-08 – 2022-02-10 (×3): 40 mg via SUBCUTANEOUS
  Filled 2022-02-08 (×2): qty 0.4

## 2022-02-08 MED ORDER — CETIRIZINE HCL 10 MG PO TABS
10.0000 mg | ORAL_TABLET | Freq: Every day | ORAL | Status: DC | PRN
  Filled 2022-02-08: qty 1

## 2022-02-08 MED ORDER — VERAPAMIL HCL ER 180 MG PO TBCR
180.0000 mg | EXTENDED_RELEASE_TABLET | Freq: Every day | ORAL | Status: DC
Start: 2022-02-08 — End: 2022-02-09
  Administered 2022-02-08: 180 mg via ORAL
  Filled 2022-02-08 (×2): qty 1

## 2022-02-08 MED ORDER — MORPHINE SULFATE (PF) 4 MG/ML IV SOLN
4.0000 mg | Freq: Once | INTRAVENOUS | Status: AC
  Administered 2022-02-08: 4 mg via INTRAVENOUS
  Filled 2022-02-08: qty 1

## 2022-02-08 MED ORDER — IRBESARTAN 150 MG PO TABS: 150.0000 mg | ORAL_TABLET | Freq: Every day | ORAL | Status: DC

## 2022-02-08 MED ORDER — IOHEXOL 300 MG/ML  SOLN
100.0000 mL | Freq: Once | INTRAMUSCULAR | Status: AC | PRN
  Administered 2022-02-08: 100 mL via INTRAVENOUS

## 2022-02-08 MED ORDER — LACTATED RINGERS IV SOLN: INTRAVENOUS | Status: AC

## 2022-02-08 MED ORDER — LACTATED RINGERS IV BOLUS
1000.0000 mL | Freq: Once | INTRAVENOUS | Status: AC
  Administered 2022-02-08: 1000 mL via INTRAVENOUS

## 2022-02-08 MED ORDER — FLUTICASONE PROPIONATE 50 MCG/ACT NA SUSP
1.0000 | Freq: Every day | NASAL | Status: DC
  Administered 2022-02-09 – 2022-02-11 (×3): 1 via NASAL
  Filled 2022-02-08: qty 16

## 2022-02-08 MED ORDER — CETIRIZINE HCL 10 MG PO TABS
10.0000 mg | ORAL_TABLET | Freq: Every day | ORAL | Status: DC
Start: 2022-02-08 — End: 2022-02-11
  Administered 2022-02-08 – 2022-02-11 (×4): 10 mg via ORAL
  Filled 2022-02-08 (×4): qty 1

## 2022-02-08 MED ORDER — ONDANSETRON HCL 4 MG/2ML IJ SOLN
4.0000 mg | Freq: Three times a day (TID) | INTRAMUSCULAR | Status: DC | PRN
Start: 2022-02-08 — End: 2022-02-09
  Administered 2022-02-08: 4 mg via INTRAVENOUS
  Filled 2022-02-08: qty 2

## 2022-02-08 MED ORDER — LACTATED RINGERS IV SOLN: INTRAVENOUS | Status: DC

## 2022-02-08 MED ORDER — HYDROMORPHONE HCL 1 MG/ML IJ SOLN
1.0000 mg | INTRAMUSCULAR | Status: DC | PRN
  Administered 2022-02-08 – 2022-02-09 (×2): 1 mg via INTRAVENOUS
  Filled 2022-02-08 (×2): qty 1

## 2022-02-08 MED ORDER — KETOROLAC TROMETHAMINE 30 MG/ML IJ SOLN
30.0000 mg | Freq: Four times a day (QID) | INTRAMUSCULAR | Status: DC
  Administered 2022-02-08 – 2022-02-09 (×4): 30 mg via INTRAVENOUS
  Filled 2022-02-08 (×3): qty 1

## 2022-02-08 MED ORDER — MOMETASONE FURO-FORMOTEROL FUM 200-5 MCG/ACT IN AERO
2.0000 | INHALATION_SPRAY | Freq: Two times a day (BID) | RESPIRATORY_TRACT | Status: DC
Start: 2022-02-08 — End: 2022-02-11
  Administered 2022-02-08 – 2022-02-11 (×6): 2 via RESPIRATORY_TRACT
  Filled 2022-02-08: qty 8.8

## 2022-02-08 MED ORDER — ONDANSETRON HCL 4 MG/2ML IJ SOLN
4.0000 mg | Freq: Once | INTRAMUSCULAR | Status: AC
  Administered 2022-02-08: 4 mg via INTRAVENOUS
  Filled 2022-02-08: qty 2

## 2022-02-08 MED ORDER — ALBUTEROL SULFATE (2.5 MG/3ML) 0.083% IN NEBU: 3.0000 mL | INHALATION_SOLUTION | Freq: Two times a day (BID) | RESPIRATORY_TRACT | Status: DC | PRN

## 2022-02-08 NOTE — H&P (Addendum)
Family Medicine Teaching Service ?Hospital Admission History and Physical ?Service Pager: [phone] ? ?Patient name: Alicia Ritter Medical record number: [id number] ?Date of birth: 1966/08/07 Age: 56 y.o. Gender: female ? ?Primary Care Provider: Goli, [place] K, MD ?Consultants: Epigastric pain ?Code Status: FULL  ?Preferred Emergency Contact:  ?Contact Information   ? ? Name Relation Home Work Mobile  ? Delphina Cahill Daughter   [phone]  ? ?  ?  ? ?Chief Complaint: Epigastric pain ? ?Assessment and Plan: ?Alicia Ritter is a 56 y.o. female presenting with acute pancreatitis. PMH is significant for medication induced pancreatitis, hypertension and SVT ? ?Acute pancreatitis ?Alicia Ritter is a 56 year old female who presents with 2 day hx of epigastric pain radiating to the back.  On admission vital signs wnl.  On examination patient has epigastric, RUQ and LUQ tenderness.  Labs remarkable for lipase 170, WBC 10, glucose 125, CMP within normal limits and UA negative.  CT abdomen pelvis: Uncomplicated acute interstitial edematous pancreatitis.  No evidence of cholelithiasis or biliary dilatation.  No obvious cause for pancreatitis such as gallstones, alcohol use, trauma, steroids, ERCP, hypercalcemia, autoimmune/malignancy or mumps.  Patient does have a history of medication induced pancreatitis after use of Augmentin/Cipro for UTI in 2021.  Patient subsequently followed up with GI as an outpatient.  Per chart review patient has a history of borderline hyperlipidemia, no recent cholesterol panel on chart.  High triglycerides could be contributing to pancreatitis so we will obtain lipid panel.  S/p morphine 4 mg, Zofran 4 mg Dilaudid 1 mg, LR 1 L bolus in the ED. Will admit for pain management as patient's pain was not well controlled in the ED. ?-Admit to FPTS, medical telemetry, pending Dr. McDiarmid ?-Vitals per floor routine ?-Up with assistance ?-N.p.o. ?-Advance diet as tolerated ?-Further fluid bolus given soft  BPs ?-Continue maintenance IV fluid LR  ?-For pain Dilaudid 1 mg every 2 hours as needed, 30 mg Toradol every 6 hourly for 5 days ?-f/u Lipid panel ?-f/u RUQ [place] ?-Consider GI consult if no improvement in symptoms ?-PT OT ?-AM CBC and CMP ? ?Hypertension ?Initial BP on admission 156/86, Bps soft 110/56 ?Home meds include valsartan '160mg'$  ?-hold valsartan given soft Bps  ?-Restart as blood pressure allows ? ?SVT ?EKG pending at time of admission.  ?Followed by Cardiology in [place]. Dx in 2014. Last episode of SVT was 2021, pt has been stable since then ?Home meds include verapamil '180mg'$  ?-Continue verapamil '180mg'$  ?-F/u EKG ? ?Asthma ?Sats 100% on room air, normal pulm exam ?Home meds: albuterol PRN, wixeal 1 puff daily  ?-Continue albuterol as needed ?-Dulera 2 puffs twice daily per hospital formulary ? ?Seasonal allergies ?Meds include: Zyrtec, azelastine and Flonase ?-Continue home meds ? ?FEN/GI: N.p.o., LR  ?Prophylaxis: Lovenox ? ?Disposition: Medical telemetry ? ?History of Present Illness:  Alicia Ritter is a 56 y.o. female presenting with epigastric pain  ? ?Pt reports 2 day hx of epigastric pain. She had got off work at 11pm, woke up yesterday progressive worsening of epigastric pain. She took tylenol and did it did not help. She tried to lay down and also drink some tea but no improvemnet in sx. It started to radiate to the back. She had some left over oxycodone pills which helped a little. Radiating to the back. 10/10 severity. Hot flashes. ? ?Last meal was yesterday morning 7am since yesterday. Works at [employer] in [place]. ETOH: none. No hx of gallstones issues.  ?Last time she had pancreatitis she was not admitted,  she was discharged the same day. She had EGD/colonoscopy with GI  which was normal.  ? ?Review Of Systems: Per HPI with the following additions:  ? ?Review of Systems  ?Constitutional:  Positive for diaphoresis. Negative for appetite change and fever.  ?Respiratory:  Negative for cough and shortness  of breath.   ?Cardiovascular:  Negative for chest pain, palpitations and leg swelling.  ?Gastrointestinal:  Positive for abdominal distention, abdominal pain and nausea. Negative for blood in stool, constipation, diarrhea and vomiting.  ?Genitourinary:  Negative for dysuria and flank pain.  ?Neurological:  Negative for dizziness.   ? ?Patient Active Problem List  ? Diagnosis Date Noted  ? Hypertension 02/03/2021  ? Borderline hyperlipidemia 09/01/2020  ? History of pancreatitis 09/01/2020  ? Other chest pain 09/01/2020  ? Class 3 severe obesity without serious comorbidity with body mass index (BMI) of 40.0 to 44.9 in adult [facility]) 01/02/2020  ? Palpitations 10/29/2019  ? SVT (supraventricular tachycardia) ([place]) 10/15/2013  ? ? ?Past Medical History: ?Past Medical History:  ?Diagnosis Date  ? Allergy   ? seasonal allergies  ? Arthritis   ? generalized  ? Asthma   ? uses inhaler  ? HTN (hypertension)   ? on meds  ? Pancreatitis   ? Pneumonia   ? Sickle cell trait ([place])   ? Tachycardia   ? hx of   ? ? ?Past Surgical History: ?Past Surgical History:  ?Procedure Laterality Date  ? INGUINAL HERNIA REPAIR Bilateral   ? LUNG LOBECTOMY Right   ? middle lobe (piece)  ? WISDOM TOOTH EXTRACTION    ? ? ?Social History: ?Social History  ? ?Tobacco Use  ? Smoking status: Never  ? Smokeless tobacco: Never  ?Vaping Use  ? Vaping Use: Never used  ?Substance Use Topics  ? Alcohol use: Not Currently  ?  Comment: occasional  ? Drug use: Never  ? ?Additional social history:   ?Please also refer to relevant sections of EMR. ? ?Family History: ?Family History  ?Problem Relation Age of Onset  ? Diabetes Mother   ? Hypertension Mother   ? Prostate cancer Father 11  ? Stroke Brother   ? Heart attack Brother   ? Diabetes Maternal Grandmother   ? Hypertension Maternal Grandmother   ? Esophageal cancer Neg Hx   ? Rectal cancer Neg Hx   ? Colon cancer Neg Hx   ? Colon polyps Neg Hx   ? Stomach cancer Neg Hx   ? ?(If not completed, MUST add  something in) ? ?Allergies and Medications: ?No Known Allergies ?No current facility-administered medications on file prior to encounter.  ? ?Current Outpatient Medications on File Prior to Encounter  ?Medication Sig Dispense Refill  ? acetaminophen (TYLENOL) 500 MG tablet Take 1,000 mg by mouth daily as needed for headache.    ? albuterol (VENTOLIN HFA) 108 (90 Base) MCG/ACT inhaler Inhale 2 puffs into the lungs 2 (two) times daily as needed (asthma).    ? azelastine (ASTELIN) 0.1 % nasal spray Place 1 spray into both nostrils daily.    ? Cetirizine HCl (ZYRTEC PO) Take 1 tablet by mouth daily.    ? Cholecalciferol 50 MCG (2000 UT) CAPS Take 1 tablet by mouth daily.    ? fluticasone (FLONASE) 50 MCG/ACT nasal spray Place 1 spray into the nose daily.    ? Multiple Vitamin (MULTI-VITAMIN) tablet Take 1 tablet by mouth daily.    ? omeprazole (PRILOSEC) 20 MG capsule Take 1 capsule (20 mg total) by  mouth daily. Take daily for 6 weeks. (Patient taking differently: Take 20 mg by mouth daily as needed (pancreatitis).) 90 capsule 3  ? oxyCODONE-acetaminophen (PERCOCET/ROXICET) 5-325 MG tablet Take 1 tablet by mouth 2 (two) times daily as needed (pain).    ? valsartan (DIOVAN) 160 MG tablet Take 160 mg by mouth daily.    ? verapamil (CALAN-SR) 180 MG CR tablet Take 180 mg by mouth at bedtime.    ? WIXELA INHUB 250-50 MCG/DOSE AEPB Inhale 1 puff into the lungs daily.    ? ? ?Objective: ?BP 108/73   Pulse 67   Temp 99.6 ?F (37.6 ?C)   Resp 15   SpO2 98%  ? ?Exam: ?General: Tired appearing 56 year old female ?Eyes: No scleral icterus, normal extraocular eye movements ?ENTM: no pharyngeal edema or erythema, no thyromegaly, no lymphadenopathy ?Neck: Supple ?Cardiovascular: S1 and S2 present, RRR ?Respiratory: CTAB, normal work of breathing ?Gastrointestinal: Abdominal distention, epigastric, right upper quadrant and left upper quadrant tenderness, no guarding, bowel sounds present ?MSK: Moving all 4 limbs ?Derm: Warm and  dry ?Neuro: Cranial nerves grossly intact ?Psych: Normal mood,normal affect ? ?Labs and Imaging: ?CBC BMET  ?Recent Labs  ?Lab 02/08/22 ?8250  ?WBC 10.9*  ?HGB 12.7  ?HCT 37.5  ?PLT 323  ? Recent Labs  ?Lab 04/24/

## 2022-02-08 NOTE — Progress Notes (Signed)
PT Cancellation Note ? ?Patient Details ?Name: Alicia Ritter ?MRN: [id number] ?DOB: [birth year] ? ? ?Cancelled Treatment:    Reason Eval/Treat Not Completed: Other (comment).  Politely declines PT, stating she is getting to the bathroom with no difficulty as long as she does not rush.  No further PT needed, please reorder if something changes. ? ? ?Ramond Dial ?02/08/2022, 1:59 PM ? ?Mee Hives, PT PhD ?Acute Rehab Dept. Number: [facility] [phone] and [place] [phone] ? ?

## 2022-02-08 NOTE — ED Provider Notes (Signed)
?[facility] ?Provider Note ? ? ?CSN: [id number] ?Arrival date & time: 02/08/22  9528 ? ?  ? ?History ? ?Chief Complaint  ?Patient presents with  ? Abdominal Pain  ? ? ?Alicia Ritter is a 56 y.o. 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  Patient describes the pain as burning/sharp.  She had leftover oxycodone pills from her previous episode of pancreatitis and attempted to use these for pain management.  She notes that that did help for about 2 hours before pain returned.  She also has severe nausea.  She reports the current pain feels very similar to previous episode of pancreatitis.  She denies diarrhea, urinary symptoms, chest pain and shortness of breath. ? ? ?Abdominal Pain ? ?  ? ?Home Medications ?Prior to Admission medications   ?Medication Sig Start Date End Date Taking? Authorizing Provider  ?acetaminophen (TYLENOL) 500 MG tablet Take 1,000 mg by mouth daily as needed for headache.   Yes [provider]  ?albuterol (VENTOLIN HFA) 108 (90 Base) MCG/ACT inhaler Inhale 2 puffs into the lungs 2 (two) times daily as needed (asthma). 08/07/13  Yes [provider]  ?azelastine (ASTELIN) 0.1 % nasal spray Place 1 spray into both nostrils daily. 01/12/22  Yes [provider]  ?Cetirizine HCl (ZYRTEC PO) Take 1 tablet by mouth daily.   Yes [provider]  ?Cholecalciferol 50 MCG (2000 UT) CAPS Take 1 tablet by mouth daily.   Yes [provider]  ?fluticasone (FLONASE) 50 MCG/ACT nasal spray Place 1 spray into the nose daily. 09/25/13  Yes [provider]  ?Multiple Vitamin (MULTI-VITAMIN) tablet Take 1 tablet by mouth daily.   Yes [provider]  ?omeprazole (PRILOSEC) 20 MG capsule Take 1 capsule (20 mg  total) by mouth daily. Take daily for 6 weeks. ?Patient taking differently: Take 20 mg by mouth daily as needed (pancreatitis). 02/23/21  Yes Armbruster, Carlota Raspberry, MD  ?oxyCODONE-acetaminophen (PERCOCET/ROXICET) 5-325 MG tablet Take 1 tablet by mouth 2 (two) times daily as needed (pain).   Yes [provider]  ?valsartan (DIOVAN) 160 MG tablet Take 160 mg by mouth daily. 12/22/21  Yes [provider]  ?verapamil (CALAN-SR) 180 MG CR tablet Take 180 mg by mouth at bedtime. 01/05/20  Yes [provider]  ?Grant Ruts INHUB 250-50 MCG/DOSE AEPB Inhale 1 puff into the lungs daily. 01/03/20  Yes [provider]  ?   ? ?Allergies    ?Patient has no known allergies.   ? ?Review of Systems   ?Review of Systems  ?Gastrointestinal:  Positive for abdominal pain.  ? ?Physical Exam ?Updated Vital Signs ?BP 108/73   Pulse 67   Temp 99.6 ?F (37.6 ?C)   Resp 15   SpO2 98%  ?Physical Exam ?Vitals and nursing note reviewed.  ?Constitutional:   ?   General: She is not in acute distress. ?   Appearance: She is ill-appearing.  ?HENT:  ?   Head: Atraumatic.  ?Eyes:  ?   Conjunctiva/sclera: Conjunctivae normal.  ?Cardiovascular:  ?   Rate and Rhythm: Normal rate and regular rhythm.  ?   Pulses: Normal pulses.  ?   Heart sounds: No murmur heard. ?Pulmonary:  ?   Effort: Pulmonary effort is normal. No respiratory distress.  ?   Breath sounds:  Normal breath sounds.  ?Abdominal:  ?   General: There is no distension.  ?   Palpations: Abdomen is soft.  ?   Tenderness: There is abdominal tenderness in the epigastric area and left upper quadrant.  ?   Comments: Soft, rounded, tender to palpation epigastrically and into the left upper quadrant, negative CVA tenderness bilaterally  ?Musculoskeletal:     ?   General: Normal range of motion.  ?   Cervical back: Normal range of motion.  ?Skin: ?   General: Skin is warm and dry.  ?   Capillary Refill: Capillary refill takes less than 2 seconds.  ?Neurological:  ?   General:  No focal deficit present.  ?   Mental Status: She is alert.  ?Psychiatric:     ?   Mood and Affect: Mood normal.  ? ? ?ED Results / Procedures / Treatments   ?Labs ?(all labs ordered are listed, but only abnormal results are displayed) ?Labs Reviewed  ?LIPASE, BLOOD - Abnormal; Notable for the following components:  ?    Result Value  ? Lipase 170 (*)   ? All other components within normal limits  ?COMPREHENSIVE METABOLIC PANEL - Abnormal; Notable for the following components:  ? Glucose, Bld 125 (*)   ? All other components within normal limits  ?CBC - Abnormal; Notable for the following components:  ? WBC 10.9 (*)   ? All other components within normal limits  ?URINALYSIS, ROUTINE W REFLEX MICROSCOPIC - Abnormal; Notable for the following components:  ? Ketones, ur 5 (*)   ? All other components within normal limits  ?I-STAT BETA HCG BLOOD, ED ([facility], [facility], [facility] ONLY)  ? ? ?EKG ?None ? ?Radiology ?CT ABDOMEN PELVIS W CONTRAST ? ?Result Date: 02/08/2022 ?CLINICAL DATA:  Abdominal pain, acute, nonlocalized. EXAM: CT ABDOMEN AND PELVIS WITH CONTRAST TECHNIQUE: Multidetector CT imaging of the abdomen and pelvis was performed using the standard protocol following bolus administration of intravenous contrast. RADIATION DOSE REDUCTION: This exam was performed according to the departmental dose-optimization program which includes automated exposure control, adjustment of the mA and/or kV according to patient size and/or use of iterative reconstruction technique. CONTRAST:  153m OMNIPAQUE IOHEXOL 300 MG/ML  SOLN COMPARISON:  Abdominopelvic CT 09/22/2020 FINDINGS: Lower chest: Stable subpleural scarring anteriorly at the right lung base. The lung bases are otherwise clear. No significant pleural effusion. Small hiatal hernia. Hepatobiliary: The liver is normal in density without suspicious focal abnormality. No evidence of gallstones, gallbladder wall thickening or biliary dilatation. Pancreas: The pancreatic body and tail are  enlarged with soft tissue stranding in the surrounding retroperitoneal fat, consistent with acute interstitial edematous pancreatitis in this patient with an elevated serum lipase level. No evidence of pancreatic hemorrhage, necrosis or focal fluid collection. No pancreatic ductal dilatation. Spleen: Normal in size without focal abnormality. Adrenals/Urinary Tract: Both adrenal glands appear normal. The kidneys appear normal without evidence of urinary tract calculus, suspicious lesion or hydronephrosis. No bladder abnormalities are seen. Stomach/Bowel: No enteric contrast administered. Small hiatal hernia. The stomach otherwise appears unremarkable. No evidence of bowel wall distension, wall thickening or surrounding inflammation. The appendix appears normal. Moderate stool throughout the colon. Vascular/Lymphatic: There are no enlarged abdominal or pelvic lymph nodes. Minimal aortic atherosclerosis. No acute vascular findings. The portal, superior mesenteric and splenic veins are patent. Reproductive: Stable appearance of the uterus and ovaries, without suspicious findings. Tubular low-density structure posterior to the right ovary is unchanged and may reflect a benign paraovarian cyst or small  hydrosalpinx. Other: No evidence of abdominal wall mass or hernia. No ascites. Musculoskeletal: No acute or significant osseous findings. Mild lumbar spondylosis. IMPRESSION: 1. Findings consistent with uncomplicated acute interstitial edematous pancreatitis. 2. No other significant findings. No evidence of cholelithiasis or biliary dilatation. 3. Prominent stool throughout the colon suggesting constipation. 4. Minimal Aortic Atherosclerosis (ICD10-I70.0). Electronically Signed   By: Richardean Sale M.D.   On: 02/08/2022 09:22   ? ?Procedures ?Procedures  ? ? ?Medications Ordered in ED ?Medications  ?morphine (PF) 4 MG/ML injection 4 mg (has no administration in time range)  ?lactated ringers bolus 1,000 mL (0 mLs  Intravenous Stopped 02/08/22 1055)  ?HYDROmorphone (DILAUDID) injection 1 mg (1 mg Intravenous Given 02/08/22 0818)  ?ondansetron [facility]) injection 4 mg (4 mg Intravenous Given 02/08/22 0818)  ?iohexol (OMNIPAQUE) 300

## 2022-02-08 NOTE — ED Triage Notes (Signed)
Patient coming from home, complaint of abdominal pain, n/v since Saturday night. Hx of HTN.  ?

## 2022-02-08 NOTE — Plan of Care (Signed)

## 2022-02-08 NOTE — Progress Notes (Signed)
FPTS Brief Progress Note ? ?S: Patient seen resting comfortably, did not disturb.  Did receive 1 dose of as needed Dilaudid 1 mg at 2200 on top of IV Toradol. ? ? ?O: ?BP 117/76 (BP Location: Right Arm)   Pulse 69   Temp 98.6 ?F (37 ?C) (Oral)   Resp 16   Ht '5\' 7"'$  (1.702 m)   Wt 129.6 kg   SpO2 96%   BMI 44.75 kg/m?   ? ? ?A/P: ?-Plans per day team: Continuing maintenance fluid and controlling pain.  Possible GI consult for recurrent acute pancreatitis. ?- Orders reviewed. Labs for AM ordered, which was adjusted as needed.  ?- If condition changes, plan includes bedside evaluation.  ? ?France Ravens, MD ?02/08/2022, 11:28 PM ?PGY-1, [place] Medicine Night Resident  ?Please page [phone] with questions.  ? ?

## 2022-02-09 DIAGNOSIS — K859 Acute pancreatitis without necrosis or infection, unspecified: Secondary | ICD-10-CM

## 2022-02-09 LAB — COMPREHENSIVE METABOLIC PANEL
ALT: 13 U/L (ref 0–44)
AST: 17 U/L (ref 15–41)
Albumin: 2.7 g/dL — ABNORMAL LOW (ref 3.5–5.0)
Alkaline Phosphatase: 52 U/L (ref 38–126)
Anion gap: 6 (ref 5–15)
BUN: 7 mg/dL (ref 6–20)
CO2: 27 mmol/L (ref 22–32)
Calcium: 8.3 mg/dL — ABNORMAL LOW (ref 8.9–10.3)
Chloride: 104 mmol/L (ref 98–111)
Creatinine, Ser: 0.75 mg/dL (ref 0.44–1.00)
GFR, Estimated: >60 mL/min (ref >60)
Glucose, Bld: 146 mg/dL — ABNORMAL HIGH (ref 70–99)
Potassium: 3.7 mmol/L (ref 3.5–5.1)
Sodium: 137 mmol/L (ref 135–145)
Total Bilirubin: 0.8 mg/dL (ref 0.3–1.2)
Total Protein: 5.8 g/dL — ABNORMAL LOW (ref 6.5–8.1)

## 2022-02-09 LAB — CBC
HCT: 30.9 % — ABNORMAL LOW (ref 36.0–46.0)
Hemoglobin: 10.3 g/dL — ABNORMAL LOW (ref 12.0–15.0)
MCH: 28.7 pg (ref 26.0–34.0)
MCHC: 33.3 g/dL (ref 30.0–36.0)
MCV: 86.1 fL (ref 80.0–100.0)
Platelets: 175 10*3/uL (ref 150–400)
RBC: 3.59 MIL/uL — ABNORMAL LOW (ref 3.87–5.11)
RDW: 13.2 % (ref 11.5–15.5)
WBC: 6.8 10*3/uL (ref 4.0–10.5)
nRBC: 0 % (ref 0.0–0.2)

## 2022-02-09 LAB — HIV ANTIBODY (ROUTINE TESTING W REFLEX): HIV Screen 4th Generation wRfx: NONREACTIVE

## 2022-02-09 MED ORDER — IBUPROFEN 400 MG PO TABS
400.0000 mg | ORAL_TABLET | Freq: Four times a day (QID) | ORAL | Status: DC | PRN
  Administered 2022-02-09: 400 mg via ORAL
  Filled 2022-02-09: qty 1

## 2022-02-09 MED ORDER — OXYCODONE HCL 5 MG PO TABS
5.0000 mg | ORAL_TABLET | ORAL | Status: DC | PRN
  Administered 2022-02-09 – 2022-02-11 (×6): 10 mg via ORAL
  Filled 2022-02-09 (×3): qty 2
  Filled 2022-02-09: qty 1
  Filled 2022-02-09 (×3): qty 2

## 2022-02-09 MED ORDER — ONDANSETRON 4 MG PO TBDP
4.0000 mg | ORAL_TABLET | Freq: Three times a day (TID) | ORAL | Status: DC | PRN
  Administered 2022-02-09 – 2022-02-10 (×2): 4 mg via ORAL
  Filled 2022-02-09 (×2): qty 1

## 2022-02-09 MED ORDER — LACTATED RINGERS IV SOLN: INTRAVENOUS | Status: DC

## 2022-02-09 MED ORDER — OXYCODONE HCL 5 MG PO TABS
5.0000 mg | ORAL_TABLET | ORAL | Status: DC | PRN
  Administered 2022-02-09 (×2): 5 mg via ORAL
  Filled 2022-02-09 (×2): qty 1

## 2022-02-09 MED ORDER — VERAPAMIL HCL ER 180 MG PO TBCR
180.0000 mg | EXTENDED_RELEASE_TABLET | Freq: Every day | ORAL | Status: DC
Start: 2022-02-09 — End: 2022-02-09

## 2022-02-09 NOTE — Progress Notes (Signed)
PT Cancellation Note ? ?Patient Details ?Name: Alicia Ritter ?MRN: [id number] ?DOB: Feb 25, 1966 ? ? ?Cancelled Treatment:    Reason Eval/Treat Not Completed: PT screened, no needs identified, will sign off; pt mobilizing in room independently, reports no concerns. Will sign off again. ? ?Mabeline Caras, PT, DPT ?Acute Rehabilitation Services  ?Pager [phone] ?Office [phone] ? ?[name] ?02/09/2022, 2:15 PM ?

## 2022-02-09 NOTE — Progress Notes (Signed)
FPTS Brief Progress Note ? ?S: Patient seen resting comfortably at bedside.  Patient was sleeping so did not disturb.  Nursing reports no concerns at this time ? ? ?O: ?BP 117/67 (BP Location: Left Arm)   Pulse 90   Temp 98.9 ?F (37.2 ?C) (Oral)   Resp 18   Ht '5\' 7"'$  (1.702 m)   Wt 285 lb 11.5 oz (129.6 kg)   SpO2 98%   BMI 44.75 kg/m?   ? ? ?A/P: ?- Plan per day team. Required higher Oxy IR dose but seems to be appropriately controlling pain at this time ?- Orders reviewed. Labs for AM ordered, which was adjusted as needed.  ?- If condition changes, plan includes bedside eval.  ? ?France Ravens, MD ?02/09/2022, 11:26 PM ?PGY-1, [place] Medicine Night Resident  ?Please page [phone] with questions.  ? ?

## 2022-02-09 NOTE — Progress Notes (Signed)
OT Cancellation Note ? ?Patient Details ?Name: Alicia Ritter ?MRN: [id number] ?DOB: October 31, 1965 ? ? ?Cancelled Treatment:    Reason Eval/Treat Not Completed: OT screened, no needs identified, will sign off (Spoke with pt about acute OT goals, she verbalized that she is atill performing at her indep baseline with self care and mobility while in the hospital. She did endorse some nausea with mobility. Ot will sign off, thank you.) ? ?Jorma Tassinari A Nachmen Mansel ?02/09/2022, 2:21 PM ?

## 2022-02-09 NOTE — Progress Notes (Signed)
Family Medicine Teaching Service ?Daily Progress Note ?Intern Pager: [phone] ? ?Patient name: Alicia Ritter Medical record number: [id number] ?Date of birth: 1966/04/06 Age: 56 y.o. Gender: female ? ?Primary Care Provider: Goli, [place] K, MD ?Consultants: None  ?Code Status: FULL  ? ?Pt Overview and Major Events to Date:  ?02/08/22: Admitted to [facility]  ? ?Assessment and Plan: ?Alicia Ritter is a 56 y.o. female presenting with acute pancreatitis. PMH is significant for medication induced pancreatitis, hypertension and SVT ? ?Acute Pancreatitis  ?Transitioned patient to oral oxycodone, ibuprofen, Zofran ODT.  Encourage increased oral intake, will half fluids and PO challenge patient. Advancing diet as tolerated. Patient remains afebrile without tachycardia and is doing well. Continuing with analgesia and advancing diet while halving fluids.  ?- Advancing diet  ?- decreasing fluids to 20m/hr ?- pain medications as above   ?- lipid panel LDL 108-no need for statin  ?- follow up with GI outpatient  ?- PT/OT eval and treat  ? ?HTN  ?Valsartan being held in setting of soft pressures.  ? ?SVT ?On verapamil, holding for now in setting of hypotension.  ? ?Asthma ?Continue Albuterol PRN, dulera 2 puffs BID  ?  ?Seasonal Allergies  ?Continue: zyrtec, azelastine, flonase  ? ?FEN/GI: Advance as tolerated, LR  ?PPx: Lovenox  ?Dispo:Home pending clinical improvement . Barriers include advancing diet.  ? ?Subjective:  ?Patient is feeling better and has tolerated three ice pops since last night.  ? ?Objective: ?Temp:  [97.7 ?F (36.5 ?C)-98.6 ?F (37 ?C)] 98 ?F (36.7 ?C) (04/25 0751) ?Pulse Rate:  [54-77] 54 (04/25 0751) ?Resp:  [13-18] 18 (04/25 0751) ?BP: (96-124)/(56-76) 98/60 (04/25 0810) ?SpO2:  [94 %-99 %] 95 % (04/25 0751) ?Weight:  [129.6 kg] 129.6 kg (04/24 2012) ?General: Alert and oriented in no apparent distress ?Heart: Regular rate and rhythm with no murmurs appreciated ?Lungs: CTA bilaterally, no wheezing ?Abdomen: Bowel  sounds present, nondistended, tender over epigastrium, soft   ?Skin: Warm and dry ?Extremities: No lower extremity edema ? ? ?Laboratory: ?Recent Labs  ?Lab 02/08/22 ?0746 02/09/22 ?0109  ?WBC 10.9* 6.8  ?HGB 12.7 10.3*  ?HCT 37.5 30.9*  ?PLT 323 175  ? ?Recent Labs  ?Lab 02/08/22 ?0746 02/09/22 ?0109  ?NA 136 137  ?K 4.0 3.7  ?CL 101 104  ?CO2 25 27  ?BUN 7 7  ?CREATININE 0.74 0.75  ?CALCIUM 9.1 8.3*  ?PROT 7.5 5.8*  ?BILITOT 0.9 0.8  ?ALKPHOS 69 52  ?ALT 17 13  ?AST 19 17  ?GLUCOSE 125* 146*  ? ? ? ?MErskine Emery MD ?02/09/2022, 12:54 PM ?PGY-1, [place] pager: [phone] text pages welcome ? ?

## 2022-02-10 DIAGNOSIS — K8500 Idiopathic acute pancreatitis without necrosis or infection: Secondary | ICD-10-CM | POA: Diagnosis not present

## 2022-02-10 DIAGNOSIS — Z6841 Body Mass Index (BMI) 40.0 and over, adult: Secondary | ICD-10-CM

## 2022-02-10 DIAGNOSIS — Z8719 Personal history of other diseases of the digestive system: Secondary | ICD-10-CM

## 2022-02-10 LAB — CBC
HCT: 31 % — ABNORMAL LOW (ref 36.0–46.0)
Hemoglobin: 10.3 g/dL — ABNORMAL LOW (ref 12.0–15.0)
MCH: 28.5 pg (ref 26.0–34.0)
MCHC: 33.2 g/dL (ref 30.0–36.0)
MCV: 85.6 fL (ref 80.0–100.0)
Platelets: 248 10*3/uL (ref 150–400)
RBC: 3.62 MIL/uL — ABNORMAL LOW (ref 3.87–5.11)
RDW: 12.9 % (ref 11.5–15.5)
WBC: 7.9 10*3/uL (ref 4.0–10.5)
nRBC: 0 % (ref 0.0–0.2)

## 2022-02-10 LAB — BASIC METABOLIC PANEL
Anion gap: 5 (ref 5–15)
BUN: <5 mg/dL — ABNORMAL LOW (ref 6–20)
CO2: 28 mmol/L (ref 22–32)
Calcium: 8.3 mg/dL — ABNORMAL LOW (ref 8.9–10.3)
Chloride: 104 mmol/L (ref 98–111)
Creatinine, Ser: 0.78 mg/dL (ref 0.44–1.00)
GFR, Estimated: >60 mL/min (ref >60)
Glucose, Bld: 118 mg/dL — ABNORMAL HIGH (ref 70–99)
Potassium: 4 mmol/L (ref 3.5–5.1)
Sodium: 137 mmol/L (ref 135–145)

## 2022-02-10 NOTE — Discharge Summary (Addendum)
Family Medicine Teaching Service ?Hospital Discharge Summary ? ?Patient name: Alicia Ritter Medical record number: [id number] ?Date of birth: 10-Jun-1966 Age: 56 y.o. Gender: female ?Date of Admission: 02/08/2022  Date of Discharge: 02/11/22 ?Admitting Physician: Blane Ohara McDiarmid, MD ? ?Primary Care Provider: Goli, [place] K, MD ?Consultants: None  ? ?Indication for Hospitalization: Epigastrium pain  ? ?Discharge Diagnoses/Problem List:  ?Principal Problem: ?  Acute pancreatitis ?Active Problems: ?  Class 3 severe obesity without serious comorbidity with body mass index (BMI) of 40.0 to 44.9 in adult [facility]) ?  History of pancreatitis ?  HTN  ?  SVTs  ? ? ?Disposition: Home  ? ?Discharge Condition: Stable  ? ?Discharge Exam: Blood pressure 118/66, pulse 72, temperature 98.7 ?F (37.1 ?C), temperature source Oral, resp. rate 18, height '5\' 7"'$  (1.702 m), weight 129.6 kg, SpO2 94 %. ?General: Alert and oriented in no apparent distress ?Heart: Regular rate and rhythm with no murmurs appreciated ?Lungs: CTA bilaterally, no wheezing ?Abdomen: Bowel sounds present, no abdominal pain ?Skin: Warm and dry ?Extremities: No lower extremity edema ? ?Brief Hospital Course:  ?Alicia Ritter is a 56 y.o.female with a history of pancreatitis, hypertension and SVT.  who was admitted to the [facility] Medicine Teaching Service at [facility] for pancreatitis. Her hospital course is detailed below: ? ?Acute Pancreatitis  ?Patient presented with 2 day hx of epigastric pain radiating to the back. CT abdomen pelvis: Uncomplicated acute interstitial edematous pancreatitis.  No evidence of cholelithiasis or biliary dilatation. No evidence of gallstone, hypertriglyceridemia, trauma, steroids, recent ERCP, elevated calcium, seems that current cause is idiopathic in nature. Patient was given analgesia as needed and transitioned off of fluids as able to tolerate PO. By discharge, tolerating PO intake without need for IVF with manageable abdominal pain.  ? ?HTN   ?Held home valsartan and verapamil in setting of soft Bps.  ? ?Other chronic conditions were medically managed with home medications and formulary alternatives as necessary (SVT, Asthma, Seasonal allergies) ? ?PCP Follow-up Recommendations: ?GI follow up for recurrent pancreatitis  ?Restart home valsartan as indicated outpatient, restarted Verapamil before discharge  ?IDA further work up if indicated  ? ? ?Significant Procedures: None  ? ?Significant Labs and Imaging:  ?Recent Labs  ?Lab 02/08/22 ?0746 02/09/22 ?0109 02/10/22 ?0107  ?WBC 10.9* 6.8 7.9  ?HGB 12.7 10.3* 10.3*  ?HCT 37.5 30.9* 31.0*  ?PLT 323 175 248  ? ?Recent Labs  ?Lab 02/08/22 ?0746 02/09/22 ?0109 02/10/22 ?0107  ?NA 136 137 137  ?K 4.0 3.7 4.0  ?CL 101 104 104  ?CO2 '25 27 28  '$ ?GLUCOSE 125* 146* 118*  ?BUN 7 7 <5*  ?CREATININE 0.74 0.75 0.78  ?CALCIUM 9.1 8.3* 8.3*  ?ALKPHOS 69 52  --   ?AST 19 17  --   ?ALT 17 13  --   ?ALBUMIN 3.5 2.7*  --   ? ? ?CT ABDOMEN PELVIS W CONTRAST ? ?Result Date: 02/08/2022 ?CLINICAL DATA:  Abdominal pain, acute, nonlocalized. EXAM: CT ABDOMEN AND PELVIS WITH CONTRAST TECHNIQUE: Multidetector CT imaging of the abdomen and pelvis was performed using the standard protocol following bolus administration of intravenous contrast. RADIATION DOSE REDUCTION: This exam was performed according to the departmental dose-optimization program which includes automated exposure control, adjustment of the mA and/or kV according to patient size and/or use of iterative reconstruction technique. CONTRAST:  12m OMNIPAQUE IOHEXOL 300 MG/ML  SOLN COMPARISON:  Abdominopelvic CT 09/22/2020 FINDINGS: Lower chest: Stable subpleural scarring anteriorly at the right lung base. The lung bases are  otherwise clear. No significant pleural effusion. Small hiatal hernia. Hepatobiliary: The liver is normal in density without suspicious focal abnormality. No evidence of gallstones, gallbladder wall thickening or biliary dilatation. Pancreas: The  pancreatic body and tail are enlarged with soft tissue stranding in the surrounding retroperitoneal fat, consistent with acute interstitial edematous pancreatitis in this patient with an elevated serum lipase level. No evidence of pancreatic hemorrhage, necrosis or focal fluid collection. No pancreatic ductal dilatation. Spleen: Normal in size without focal abnormality. Adrenals/Urinary Tract: Both adrenal glands appear normal. The kidneys appear normal without evidence of urinary tract calculus, suspicious lesion or hydronephrosis. No bladder abnormalities are seen. Stomach/Bowel: No enteric contrast administered. Small hiatal hernia. The stomach otherwise appears unremarkable. No evidence of bowel wall distension, wall thickening or surrounding inflammation. The appendix appears normal. Moderate stool throughout the colon. Vascular/Lymphatic: There are no enlarged abdominal or pelvic lymph nodes. Minimal aortic atherosclerosis. No acute vascular findings. The portal, superior mesenteric and splenic veins are patent. Reproductive: Stable appearance of the uterus and ovaries, without suspicious findings. Tubular low-density structure posterior to the right ovary is unchanged and may reflect a benign paraovarian cyst or small hydrosalpinx. Other: No evidence of abdominal wall mass or hernia. No ascites. Musculoskeletal: No acute or significant osseous findings. Mild lumbar spondylosis. IMPRESSION: 1. Findings consistent with uncomplicated acute interstitial edematous pancreatitis. 2. No other significant findings. No evidence of cholelithiasis or biliary dilatation. 3. Prominent stool throughout the colon suggesting constipation. 4. Minimal Aortic Atherosclerosis (ICD10-I70.0). Electronically Signed   By: Richardean Sale M.D.   On: 02/08/2022 09:22  ? ?US Abdomen Limited RUQ (LIVER/GB) ? ?Result Date: 02/08/2022 ?CLINICAL DATA:  Gallstone.  Findings of pancreatitis on earlier CT. EXAM: ULTRASOUND ABDOMEN LIMITED RIGHT  UPPER QUADRANT COMPARISON:  Abdominopelvic CT 02/08/2022.  Ultrasound 02/14/2020. FINDINGS: Gallbladder: No gallstones or wall thickening visualized. No sonographic Murphy sign noted by sonographer. Common bile duct: Diameter: 6 mm.  No evidence of choledocholithiasis. Liver: Mildly increased hepatic echogenicity. No focal abnormality identified. Portal vein is patent on color Doppler imaging with normal direction of blood flow towards the liver. Other: Pancreas not imaged. IMPRESSION: No evidence of gallstones or other significant right upper quadrant abdominal findings. Pancreas not imaged. Electronically Signed   By: Richardean Sale M.D.   On: 02/08/2022 12:53   ? ? ?Results/Tests Pending at Time of Discharge:  ?Unresulted Labs (From admission, onward)  ? ? None  ? ?  ? ? ? ?Discharge Medications:  ?Allergies as of 02/11/2022   ?No Known Allergies ?  ? ?  ?Medication List  ?  ? ?STOP taking these medications   ? ?oxyCODONE-acetaminophen 5-325 MG tablet ?Commonly known as: PERCOCET/ROXICET ?  ?valsartan 160 MG tablet ?Commonly known as: DIOVAN ?  ? ?  ? ?TAKE these medications   ? ?acetaminophen 500 MG tablet ?Commonly known as: TYLENOL ?Take 1,000 mg by mouth daily as needed for headache. ?  ?albuterol 108 (90 Base) MCG/ACT inhaler ?Commonly known as: VENTOLIN HFA ?Inhale 2 puffs into the lungs 2 (two) times daily as needed (asthma). ?  ?azelastine 0.1 % nasal spray ?Commonly known as: ASTELIN ?Place 1 spray into both nostrils daily. ?  ?Cholecalciferol 50 MCG (2000 UT) Caps ?Take 1 tablet by mouth daily. ?  ?fluticasone 50 MCG/ACT nasal spray ?Commonly known as: FLONASE ?Place 1 spray into the nose daily. ?  ?Multi-Vitamin tablet ?Take 1 tablet by mouth daily. ?  ?omeprazole 20 MG capsule ?Commonly known as: PRILOSEC ?Take 1 capsule (20 mg  total) by mouth daily. Take daily for 6 weeks. ?What changed:  ?when to take this ?reasons to take this ?additional instructions ?  ?ondansetron 4 MG disintegrating  tablet ?Commonly known as: ZOFRAN-ODT ?Take 1 tablet (4 mg total) by mouth every 8 (eight) hours as needed for nausea or vomiting. ?  ?oxyCODONE 5 MG immediate release tablet ?Commonly known as: Oxy IR/ROXICODONE ?Take 1

## 2022-02-10 NOTE — Progress Notes (Signed)
Mobility Specialist Progress Note: ? ? 02/10/22 1116  ?Mobility  ?Activity Ambulated independently to bathroom  ?Level of Assistance Independent  ?Assistive Device None  ?Distance Ambulated (ft) 20 ft  ?Activity Response Tolerated well  ?$Mobility charge 1 Mobility  ? ?Pt received EOB after just using the restroom. Declining hallway ambulation. Pt independently walking around room. Left EOB with call bell in reach and all needs met.  ? ?Alicia Ritter ?Mobility Specialist ?Primary Phone [phone]' ?

## 2022-02-10 NOTE — Progress Notes (Signed)
Family Medicine Teaching Service ?Daily Progress Note ?Intern Pager: [phone] ? ?Patient name: Alicia Ritter Medical record number: [id number] ?Date of birth: 26-Jun-1966 Age: 56 y.o. Gender: female ? ?Primary Care Provider: Goli, [place] K, MD ?Consultants: None  ?Code Status: FULL ? ?Pt Overview and Major Events to Date:  ?02/08/22: Admitted to [facility]  ? ?Assessment and Plan: ? ?Alicia Ritter is a 56 y.o. female presenting with acute pancreatitis. PMH is significant for medication induced pancreatitis, hypertension and SVT.  ? ?Acute pancreatitis ?Second instance of acute pancreatitis for this patient, no evidence of hypertriglyceridemia or alcohol use.  Possibly idiopathic, will need to follow-up with GI who she has been established with in the past for further evaluation.  Continuing to advance diet with documented p.o. intake of 240 mL yesterday.  Patient remains on LR but will continue to decrease as p.o. intake increases.  Consider p.o. challenge today.  Will continue analgesia as well. ?- Advancing diet ?- Decrease fluids as able ?- Pain regimen of oxycodone 5-10 mg Q4 and ibuprofen '400mg'$  every 6 as needed ?- Follow-up with GI outpatient ? ?Normocytic Anemia  IDA ?Patient has what appears to be history of iron deficiency anemia, has had recent EGD and colonoscopy that were both normal in 02/2021.  Likely secondary to heavy menstrual cycle. Iron studies iron 43, sat ratios 10.2.  ?- Can further work-up with PCP outpatient if indicated ? ?Hypertension ?Valsartan being held in the setting of normotension ? ?SVT ?Verapamil held, blood pressures improved today.  We will likely continue to hold valsartan and verapamil. ? ?Chronic and stable, continuing home medications ?Asthma ?Seasonal allergies ? ?FEN/GI: Advance as tolerated to regular ?PPx: Lovenox ?Dispo:Home pending clinical improvement . Barriers include advancing diet and weaning from fluids.  ? ?Subjective:  ?Patient had grits yesterday, hot tea, and ice pops.  She is tolerating the food well, no BM as of yet.  ? ?Objective: ?Temp:  [97.8 ?F (36.6 ?C)-100.1 ?F (37.8 ?C)] 100.1 ?F (37.8 ?C) (04/26 0762) ?Pulse Rate:  [81-90] 85 (04/26 0716) ?Resp:  [16-18] 16 (04/26 0716) ?BP: (112-121)/(57-78) 112/66 (04/26 2633) ?SpO2:  [93 %-98 %] 96 % (04/26 0757) ?General: Alert and oriented in no apparent distress, sitting up at bedside  ?Heart: Regular rate and rhythm with no murmurs appreciated ?Lungs: CTA bilaterally, no wheezing ?Abdomen: Bowel sounds present, epigastric tenderness without distension  ?Skin: Warm and dry ?Extremities: No lower extremity edema ? ? ?Laboratory: ?Recent Labs  ?Lab 02/08/22 ?0746 02/09/22 ?0109 02/10/22 ?0107  ?WBC 10.9* 6.8 7.9  ?HGB 12.7 10.3* 10.3*  ?HCT 37.5 30.9* 31.0*  ?PLT 323 175 248  ? ?Recent Labs  ?Lab 02/08/22 ?0746 02/09/22 ?0109 02/10/22 ?0107  ?NA 136 137 137  ?K 4.0 3.7 4.0  ?CL 101 104 104  ?CO2 '25 27 28  '$ ?BUN 7 7 <5*  ?CREATININE 0.74 0.75 0.78  ?CALCIUM 9.1 8.3* 8.3*  ?PROT 7.5 5.8*  --   ?BILITOT 0.9 0.8  --   ?ALKPHOS 69 52  --   ?ALT 17 13  --   ?AST 19 17  --   ?GLUCOSE 125* 146* 118*  ? ? ? ? ?Erskine Emery, MD ?02/10/2022, 8:48 AM ?PGY-1, [facility] ?[facility] Intern pager: [phone], text pages welcome ? ?

## 2022-02-10 NOTE — Progress Notes (Signed)
FPTS Brief Note ?Reviewed patient's vitals, recent notes.  ?Vitals:  ? 02/10/22 1522 02/10/22 1946  ?BP: 106/71 (!) 143/68  ?Pulse: 72 72  ?Resp: 17 18  ?Temp: 98.2 ?F (36.8 ?C) 98.4 ?F (36.9 ?C)  ?SpO2: 95% 93%  ? ?At this time, no change in plan from day progress note.  ?France Ravens, MD ?Page [phone] with questions about this patient.  ? ? ?

## 2022-02-10 NOTE — Hospital Course (Addendum)
Milo Purrington is a 56 y.o.female with a history of pancreatitis, hypertension and SVT.  who was admitted to the [facility] Medicine Teaching Service at [facility] for pancreatitis. Her hospital course is detailed below: ? ?Acute Pancreatitis  ?Patient presented with 2 day hx of epigastric pain radiating to the back. CT abdomen pelvis: Uncomplicated acute interstitial edematous pancreatitis.  No evidence of cholelithiasis or biliary dilatation. No evidence of gallstone, hypertriglyceridemia, trauma, steroids, recent ERCP, elevated calcium, seems that current cause is idiopathic in nature. Patient was given analgesia as needed and transitioned off of fluids as able to tolerate PO. By discharge, tolerating PO intake without need for IVF with manageable abdominal pain.  ? ?HTN  ?Held home valsartan and verapamil in setting of soft Bps.  ? ?Other chronic conditions were medically managed with home medications and formulary alternatives as necessary (SVT, Asthma, Seasonal allergies) ? ?PCP Follow-up Recommendations: ?GI follow up for recurrent pancreatitis  ?Restart home valsartan as indicated outpatient, restarted Verapamil before discharge  ?IDA further work up if indicated  ?

## 2022-02-10 NOTE — Discharge Instructions (Signed)
Dear [facility],  ? ?Thank you so much for allowing [place] to be part of your care!  You were admitted to [facility] for pancreatitis. We treated you with fluids and pain control while increasing your diet slowly. You were able to tolerate intake with controllable pain prior to discharge.  ? ?Please follow up with your PCP a week after discharge and your GI doctor ASAP.   ? ? ?POST-HOSPITAL & CARE INSTRUCTIONS ?Follow up with GI ASAP ?Please let PCP/Specialists know of any changes that were made.  ?Please see medications section of this packet for any medication changes.  ? ?DOCTOR'S APPOINTMENT & FOLLOW UP CARE INSTRUCTIONS  ?No future appointments. ? ?RETURN PRECAUTIONS: ? ?Please return if you experience severe abdominal pain, chest pain, inability to eat or drink, fever, confusion  ? ?Take care and be well! ? ?Family Medicine Teaching Service  ?[place]  ?[facility]  ?[address] [place], [place] [address] ?[phone] ? ?

## 2022-02-11 ENCOUNTER — Other Ambulatory Visit (HOSPITAL_COMMUNITY): Payer: Self-pay

## 2022-02-11 DIAGNOSIS — Z6841 Body Mass Index (BMI) 40.0 and over, adult: Secondary | ICD-10-CM | POA: Diagnosis not present

## 2022-02-11 DIAGNOSIS — K8500 Idiopathic acute pancreatitis without necrosis or infection: Secondary | ICD-10-CM | POA: Diagnosis not present

## 2022-02-11 DIAGNOSIS — Z8719 Personal history of other diseases of the digestive system: Secondary | ICD-10-CM | POA: Diagnosis not present

## 2022-02-11 MED ORDER — ONDANSETRON 4 MG PO TBDP
4.0000 mg | ORAL_TABLET | Freq: Three times a day (TID) | ORAL | 0 refills | Status: AC | PRN
  Filled 2022-02-11: qty 15, 5d supply, fill #0

## 2022-02-11 MED ORDER — OXYCODONE HCL 5 MG PO TABS
5.0000 mg | ORAL_TABLET | ORAL | 0 refills | Status: AC | PRN
Start: 2022-02-11 — End: 2022-02-14
  Filled 2022-02-11: qty 12, 3d supply, fill #0

## 2022-02-11 NOTE — TOC CM/SW Note (Signed)
?  Transition of Care (TOC) Screening Note ? ? ?Patient Details  ?Name: Alicia Ritter ?Date of Birth: Aug 29, 1966 ? ? ? ? ? ?Transition of Care Department [facility]) has reviewed patient and no TOC needs have been identified at this time. We will continue to monitor patient advancement through interdisciplinary progression rounds. If new patient transition needs arise, please place a TOC consult. ?  ?

## 2022-02-11 NOTE — Progress Notes (Signed)
Work noted provided to pt at this time. Awaiting clothes from home for DC. ?

## 2022-02-11 NOTE — Progress Notes (Signed)
Family Medicine Teaching Service ?Daily Progress Note ?Intern Pager: [phone] ? ?Patient name: Lissy Deuser Medical record number: [id number] ?Date of birth: 02/23/1966 Age: 56 y.o. Gender: female ? ?Primary Care Provider: Goli, [place] K, MD ?Consultants: None  ?Code Status: FULL  ? ?Pt Overview and Major Events to Date:  ?02/08/22: Admitted to [place]  ? ?Assessment and Plan: ? ?Syretta Fincher is a 56 y.o. female presenting with acute pancreatitis. PMH is significant for medication induced pancreatitis, hypertension and SVT.  ? ?Acute Pancreatitis  ?Discontinued IV fluids yesterday, p.o. intake today improved from yesterday.  Vital signs have been stable and pain is well controlled with as needed oxycodone, Zofran for nausea, and ibuprofen. ?- Advancing diet ?- IV fluids discontinued ?- Pain regimen of oxycodone 5-10 mg every 4 as needed and ibuprofen 400 mg every 6 as needed ?- Zofran for nausea ?- Follow-up with GI outpatient ? ?Normocytic anemia IDA ?Work-up has been done outpatient.  Stable while in hospital. ? ?Hypertension ?Normotensive this morning, holding valsartan. ? ?SVT ?Verapamil has been held for the last couple of days due to soft blood pressures.  Blood pressure improved today.  Can restart on discharge if indicated. ? ?Chronic and stable, continue home medications ?Asthma ?Seasonal allergies ? ?FEN/GI: Regular  ?PPx: Lovenox  ?Dispo:Home today. Barriers include improved PO intake, otherwise stable for discharge.  ? ?Subjective:  ?Patient had two meals yesterday and tolerated this well.  ? ?Objective: ?Temp:  [98.2 ?F (36.8 ?C)-98.7 ?F (37.1 ?C)] 98.7 ?F (37.1 ?C) (04/27 0500) ?Pulse Rate:  [72-94] 72 (04/27 0500) ?Resp:  [17-19] 18 (04/27 0500) ?BP: (106-143)/(66-72) 118/66 (04/27 0500) ?SpO2:  [93 %-96 %] 94 % (04/27 0500) ?General: Alert and oriented in no apparent distress ?Heart: Regular rate and rhythm with no murmurs appreciated ?Lungs: CTA bilaterally, no wheezing ?Abdomen: Bowel sounds present,  no abdominal pain ?Skin: Warm and dry ?Extremities: No lower extremity edema ? ?Laboratory: ?Recent Labs  ?Lab 02/08/22 ?0746 02/09/22 ?0109 02/10/22 ?0107  ?WBC 10.9* 6.8 7.9  ?HGB 12.7 10.3* 10.3*  ?HCT 37.5 30.9* 31.0*  ?PLT 323 175 248  ? ?Recent Labs  ?Lab 02/08/22 ?0746 02/09/22 ?0109 02/10/22 ?0107  ?NA 136 137 137  ?K 4.0 3.7 4.0  ?CL 101 104 104  ?CO2 '25 27 28  '$ ?BUN 7 7 <5*  ?CREATININE 0.74 0.75 0.78  ?CALCIUM 9.1 8.3* 8.3*  ?PROT 7.5 5.8*  --   ?BILITOT 0.9 0.8  --   ?ALKPHOS 69 52  --   ?ALT 17 13  --   ?AST 19 17  --   ?GLUCOSE 125* 146* 118*  ? ? ? ? ?Erskine Emery, MD ?02/11/2022, 7:24 AM ?PGY-1, [place] [facility] ?[facility] Intern pager: [phone], text pages welcome ? ?

## 2022-02-11 NOTE — Progress Notes (Signed)
Discharge instructions given and TOC meds delivered to bedside. Pt is waiting on family member to bring clothes and to provide ride home upon DC. Instructed pt to call nurses station when ride is here and she is dressed. All belongings packed and at pt bedside. ?

## 2022-03-02 ENCOUNTER — Ambulatory Visit: Payer: Federal, State, Local not specified - PPO | Admitting: Nurse Practitioner

## 2022-03-02 ENCOUNTER — Encounter: Payer: Self-pay | Admitting: Nurse Practitioner

## 2022-03-02 VITALS — BP 130/84 | HR 80 | Ht 67.0 in | Wt 281.1 lb

## 2022-03-02 DIAGNOSIS — K8500 Idiopathic acute pancreatitis without necrosis or infection: Secondary | ICD-10-CM

## 2022-03-02 NOTE — Progress Notes (Signed)
Agree with assessment and plan as outlined.  Can check an IgG4 level.  I also think follow-up MRCP in a few months after she has had her pancreatitis resolved may be reasonable to reassess her pancreas given recurrence of unclear etiology, given no alcohol, [place] looks good otherwise, no sludge or stones. Thanks ?

## 2022-03-02 NOTE — Patient Instructions (Signed)
If you are age 56 or younger, your body mass index should be between 19-25. Your Body mass index is 44.03 kg/m?Marland Kitchen If this is out of the aformentioned range listed, please consider follow up with your Primary Care Provider.  ?________________________________________________________ ? ?The [facility] providers would like to encourage you to use [facility] to communicate with providers for non-urgent requests or questions.  Due to long hold times on the telephone, sending your provider a message by [facility] may be a faster and more efficient way to get a response.  Please allow 48 business hours for a response.  Please remember that this is for non-urgent requests.  ?_______________________________________________________ ? ?Continue current medications/treatments. ? ?Paula's nurse, Mickel Baas, will contact you in a few days to see how you are doing. Follow a bland, low fat diet until the office contact's you. ? ?Follow up as needed for now. ? ?Thank you for entrusting me with your care and choosing [facility]. ? ?Tye Savoy, NP-C ?

## 2022-03-02 NOTE — Progress Notes (Signed)
? ? ?Assessment  ? ?Patient Profile:  ?Alicia Ritter is a 56 y.o. female known to Dr. Havery Moros with a past medical history of obesity, pancreatitis, diverticulosis, HTN, SVT.  Additional medical history as listed in [place] . ? ?Recurrent pancreatitis.  ?Admitted last month with recurrent acute pancreatitis.  CT scan showed the pancreatic body and tail enlarged with soft tissue ?stranding in the surrounding retroperitoneal fat, consistent with ?acute interstitial edematous pancreatitis. Etiology unclear. [place] negative for gallstones. Liver chemistries normal. She doesn't consume ETOH.  Triglycerides normal at 48. Normal serum calcium.  This was her second episode of acute pancreatitis. Etiology of first episode in April 2021 was also undetermined. She did start Valsartan a few months ago but wasn't on it the first time she had pancreatitis. It has now been discontinued due to low BP.  Some considerations include microlithiasis. Autoimmune?  ? ?Plan  ? ?Consider checking igG4 for autoimmune pancreatitis.  ?Would MRCP be of value? Will ask Dr. Havery Moros to review her CT scan.  ?Her pain had totally resolved but today she is feeling some mild gnawing in upper abdomen. Recommended bland diet for now. Will call her to get a condition update in a couple of days.  ? ?HPI  ? ?Chief Complaint : hospital follow up ? ?[name] was hospitalized for 4 days in April for pancreatitis. We didn't see her but Hospitalist told her to make a follow up with [place].  ? ?[name] had pancreatitis in April 2021. Etiology was unclear. I saw her again in November 2021 with recurrent upper abdominal pain but no evidence for recurrent pancreatitis  ? ?02/08/22 ED ?Presented with abdominal pain and nausea. Tylenol helped but pain returned and so she went to ED. She doesn't drink Etoh. No [place] of pancreas problems. She was taking flonase, astelin, advair, verapamil , valsaartan, vit D and MV. Valsartan and astelin had been started a couple of months prior to  admission. Otherwise all meds were same as the first time she had pancreatitis. The hospital stopped valsartan due to low BP. On admission her lipase was 170. Liver tests were okay. Triglycerides 48. Normal serum calcium.  ? ?PCP's two weeks ago drew labs and she says lipase was back to normal. Her pain had resolved but today she has had some gnawing / mild nausea. She is a little concerned that this could be beginning of recurrent pancreatitis.  ? ?She was terribly constipated after hospitalization. She took Miralax which didn't help.She went 11 days without a BM. Used a Fleet's enema, had good results and BMs about back to baseline.  ? ?Previous GI Evaluation  ? ?May 2022 colonoscopy and EGD for IDA ?- Stool in the entire examined colon, able to be lavaged to complete the exam. ?- Diverticulosis in the transverse colon and in the left colon. ?- Three 3 to 4 mm polyps in the sigmoid colon, removed with a cold snare. Resected and retrieved.  ? ?- Esophagogastric landmarks identified. ?- 2 cm hiatal hernia. ?- LA Grade A reflux esophagitis with slight nodularity, suspect inflammatory in nature. ?Biopsied. ?- Erythematous mucosa in the antrum. ?- Normal stomach otherwise - biopsies taken to rule out H pylori ?-Normal duodenal bulb and 2nd portion of duodenum.  ? ?Diagnosis ?1. Surgical [P], gastric Anturm and gastric body ?- GASTRIC ANTRAL MUCOSA WITH NONSPECIFIC REACTIVE GASTROPATHY ?- GASTRIC OXYNTIC MUCOSA WITH NO SPECIFIC HISTOPATHOLOGIC CHANGES ?- WARTHIN STARRY STAIN IS NEGATIVE FOR HELICOBACTER PYLORI ?2. Surgical [P], ge junction ?- ESOPHAGEAL SQUAMOUS AND CARDIAC  MUCOSA WITH MILDLY ACTIVE CHRONIC NONSPECIFIC CARDITIS ?- NEGATIVE FOR INTESTINAL METAPLASIA OR DYSPLASIA ?3. Surgical [P], colon, sigmoid x3, polyp (3) ?- HYPERPLASTIC POLYP(S) ?- OTHER FRAGMENT OF POLYPOID COLONIC MUCOSA WITH PROMINENT LYMPHOID AGGREGATE ? ?Imaging  ? ? 02/08/22 CTAP w/ contrast ?IMPRESSION: ?1. Findings consistent with  uncomplicated acute interstitial edematous pancreatitis. ?2. No other significant findings. No evidence of cholelithiasis or biliary dilatation. ?3. Prominent stool throughout the colon suggesting constipation. ?4. Minimal Aortic Atherosclerosis (ICD10-I70.0). ? ?RUQ [place] ?IMPRESSION: ?No evidence of gallstones or other significant right upper quadrant abdominal findings. Pancreas not imaged. ? ?Labs:  ? ?  Latest Ref Rng & Units 02/10/2022  ?  1:07 AM 02/09/2022  ?  1:09 AM 02/08/2022  ?  7:46 AM  ?CBC  ?WBC 4.0 - 10.5 K/uL 7.9   6.8   10.9    ?Hemoglobin 12.0 - 15.0 g/dL 10.3   10.3   12.7    ?Hematocrit 36.0 - 46.0 % 31.0   30.9   37.5    ?Platelets 150 - 400 K/uL 248   175   323    ? ? ? ?  Latest Ref Rng & Units 02/09/2022  ?  1:09 AM 02/08/2022  ?  7:46 AM 01/24/2020  ?  8:17 PM  ?Hepatic Function  ?Total Protein 6.5 - 8.1 g/dL 5.8   7.5   7.6    ?Albumin 3.5 - 5.0 g/dL 2.7   3.5   3.7    ?AST 15 - 41 U/L 17   19   16     ?ALT 0 - 44 U/L 13   17   17     ?Alk Phosphatase 38 - 126 U/L 52   69   70    ?Total Bilirubin 0.3 - 1.2 mg/dL 0.8   0.9   0.6    ? ? ? ?Past Medical History:  ?Diagnosis Date  ? Allergy   ? seasonal allergies  ? Arthritis   ? generalized  ? Asthma   ? uses inhaler  ? HTN (hypertension)   ? on meds  ? Pancreatitis   ? Pneumonia   ? Sickle cell trait ([place])   ? Tachycardia   ? hx of   ? ? ?Past Surgical History:  ?Procedure Laterality Date  ? INGUINAL HERNIA REPAIR Bilateral   ? LUNG LOBECTOMY Right   ? middle lobe (piece)  ? WISDOM TOOTH EXTRACTION    ? ? ?Current Medications, Allergies, Family History and Social History were reviewed in [employer] record. ?  ?  ?Current Outpatient Medications  ?Medication Sig Dispense Refill  ? acetaminophen (TYLENOL) 500 MG tablet Take 1,000 mg by mouth daily as needed for headache.    ? albuterol (VENTOLIN HFA) 108 (90 Base) MCG/ACT inhaler Inhale 2 puffs into the lungs 2 (two) times daily as needed (asthma).    ? azelastine (ASTELIN) 0.1 %  nasal spray Place 1 spray into both nostrils daily.    ? Cetirizine HCl (ZYRTEC PO) Take 1 tablet by mouth daily.    ? Cholecalciferol 50 MCG (2000 UT) CAPS Take 1 tablet by mouth daily.    ? fluticasone (FLONASE) 50 MCG/ACT nasal spray Place 1 spray into the nose daily.    ? Multiple Vitamin (MULTI-VITAMIN) tablet Take 1 tablet by mouth daily.    ? omeprazole (PRILOSEC) 20 MG capsule Take 1 capsule (20 mg total) by mouth daily. Take daily for 6 weeks. (Patient taking differently: Take 20 mg by mouth  daily as needed (pancreatitis).) 90 capsule 3  ? verapamil (CALAN-SR) 240 MG CR tablet SMARTSIG:1 Tablet(s) By Mouth Every Evening    ? WIXELA INHUB 250-50 MCG/DOSE AEPB Inhale 1 puff into the lungs daily.    ? ondansetron (ZOFRAN-ODT) 4 MG disintegrating tablet Take 1 tablet (4 mg total) by mouth every 8 (eight) hours as needed for nausea or vomiting. (Patient not taking: Reported on 03/02/2022) 15 tablet 0  ? ?No current facility-administered medications for this visit.  ? ? ?Review of Systems: ?No chest pain. No shortness of breath. No urinary complaints.  ? ? ?Physical Exam ? ?Wt Readings from Last 3 Encounters:  ?03/02/22 281 lb 2 oz (127.5 kg)  ?02/08/22 285 lb 11.5 oz (129.6 kg)  ?02/23/21 289 lb (131.1 kg)  ? ? ?BP 130/84   Pulse 80   Ht 5' 7"  (1.702 m)   Wt 281 lb 2 oz (127.5 kg)   BMI 44.03 kg/m?  ?Constitutional:  Generally well appearing female in no acute distress. ?Psychiatric: Pleasant. Normal mood and affect. Behavior is normal. ?EENT: Pupils normal.  Conjunctivae are normal. No scleral icterus. ?Neck supple.  ?Cardiovascular: Normal rate, regular rhythm. No edema ?Pulmonary/chest: Effort normal and breath sounds normal. No wheezing, rales or rhonchi. ?Abdominal: Soft, nondistended, nontender. Bowel sounds active throughout. There are no masses palpable. No hepatomegaly. ?Neurological: Alert and oriented to person place and time. ?Skin: Skin is warm and dry. No rashes noted. ? ?I spent 30 minutes  total reviewing records, obtaining history, performing exam, counseling patient and documenting visit / findings.  ? ?Tye Savoy, NP  03/02/2022, 9:51 AM ? ? ? ? ? ? ? ? ? ?

## 2022-03-05 ENCOUNTER — Other Ambulatory Visit: Payer: Self-pay

## 2022-03-05 ENCOUNTER — Telehealth: Payer: Self-pay

## 2022-03-05 DIAGNOSIS — K8500 Idiopathic acute pancreatitis without necrosis or infection: Secondary | ICD-10-CM

## 2022-03-05 NOTE — Telephone Encounter (Signed)
Lab and MRCP order placed. Left message for pt to call back.

## 2022-03-05 NOTE — Telephone Encounter (Signed)
-----   Message from Willia Craze, NP sent at 03/03/2022  5:05 PM EDT ----- Mickel Baas,  Please let Kersten know I talked with Dr Havery Moros.  He agrees that we should rule out autoimmune pancreatitis.  Would you please have her come in for labs to include an IgG4 level.  Let me know if you have any problem finding that test . Also, he agrees that an MRCP in a few months after the pancreatitis has resolved would be helpful.  Will you please arrange for her to have an MRCP sometime in late June or early July.  Please get her an appointment with Dr. Havery Moros to follow that   Thanks

## 2022-03-09 NOTE — Telephone Encounter (Signed)
Left message for pt to call back  °

## 2022-03-10 NOTE — Telephone Encounter (Signed)
Spoke with pt and gave pt recommendations. Office appt with Dr. Havery Moros scheduled on 7/3 at 9:20 am. Pt verbalized understanding and had no other concerns at end of call.

## 2022-03-26 ENCOUNTER — Other Ambulatory Visit: Payer: Federal, State, Local not specified - PPO

## 2022-03-26 DIAGNOSIS — K8500 Idiopathic acute pancreatitis without necrosis or infection: Secondary | ICD-10-CM

## 2022-03-30 LAB — IGG SUBCLASS 4: IgG Subclass 4: 27.1 mg/dL (ref 4.0–86.0)

## 2022-04-12 ENCOUNTER — Ambulatory Visit (HOSPITAL_COMMUNITY)
Admission: RE | Admit: 2022-04-12 | Discharge: 2022-04-12 | Disposition: A | Discharge status: Home / Self Care | Payer: Federal, State, Local not specified - PPO | Source: Ambulatory Visit | Attending: Nurse Practitioner | Admitting: Nurse Practitioner

## 2022-04-12 ENCOUNTER — Other Ambulatory Visit: Payer: Self-pay | Admitting: Nurse Practitioner

## 2022-04-12 DIAGNOSIS — K8500 Idiopathic acute pancreatitis without necrosis or infection: Secondary | ICD-10-CM

## 2022-04-12 MED ORDER — GADOBUTROL 1 MMOL/ML IV SOLN
10.0000 mL | Freq: Once | INTRAVENOUS | Status: AC | PRN
  Administered 2022-04-12: 10 mL via INTRAVENOUS

## 2022-04-19 ENCOUNTER — Ambulatory Visit: Payer: Federal, State, Local not specified - PPO | Admitting: Gastroenterology

## 2022-05-06 ENCOUNTER — Other Ambulatory Visit (HOSPITAL_COMMUNITY): Payer: Self-pay

## 2022-05-23 IMAGING — DX DG CHEST 2V
2 series · 2 of 2 positions shown · non-contrast
Comparison: None.

CLINICAL DATA: Chest pain

EXAM:
CHEST - 2 VIEW

[w chest pa]
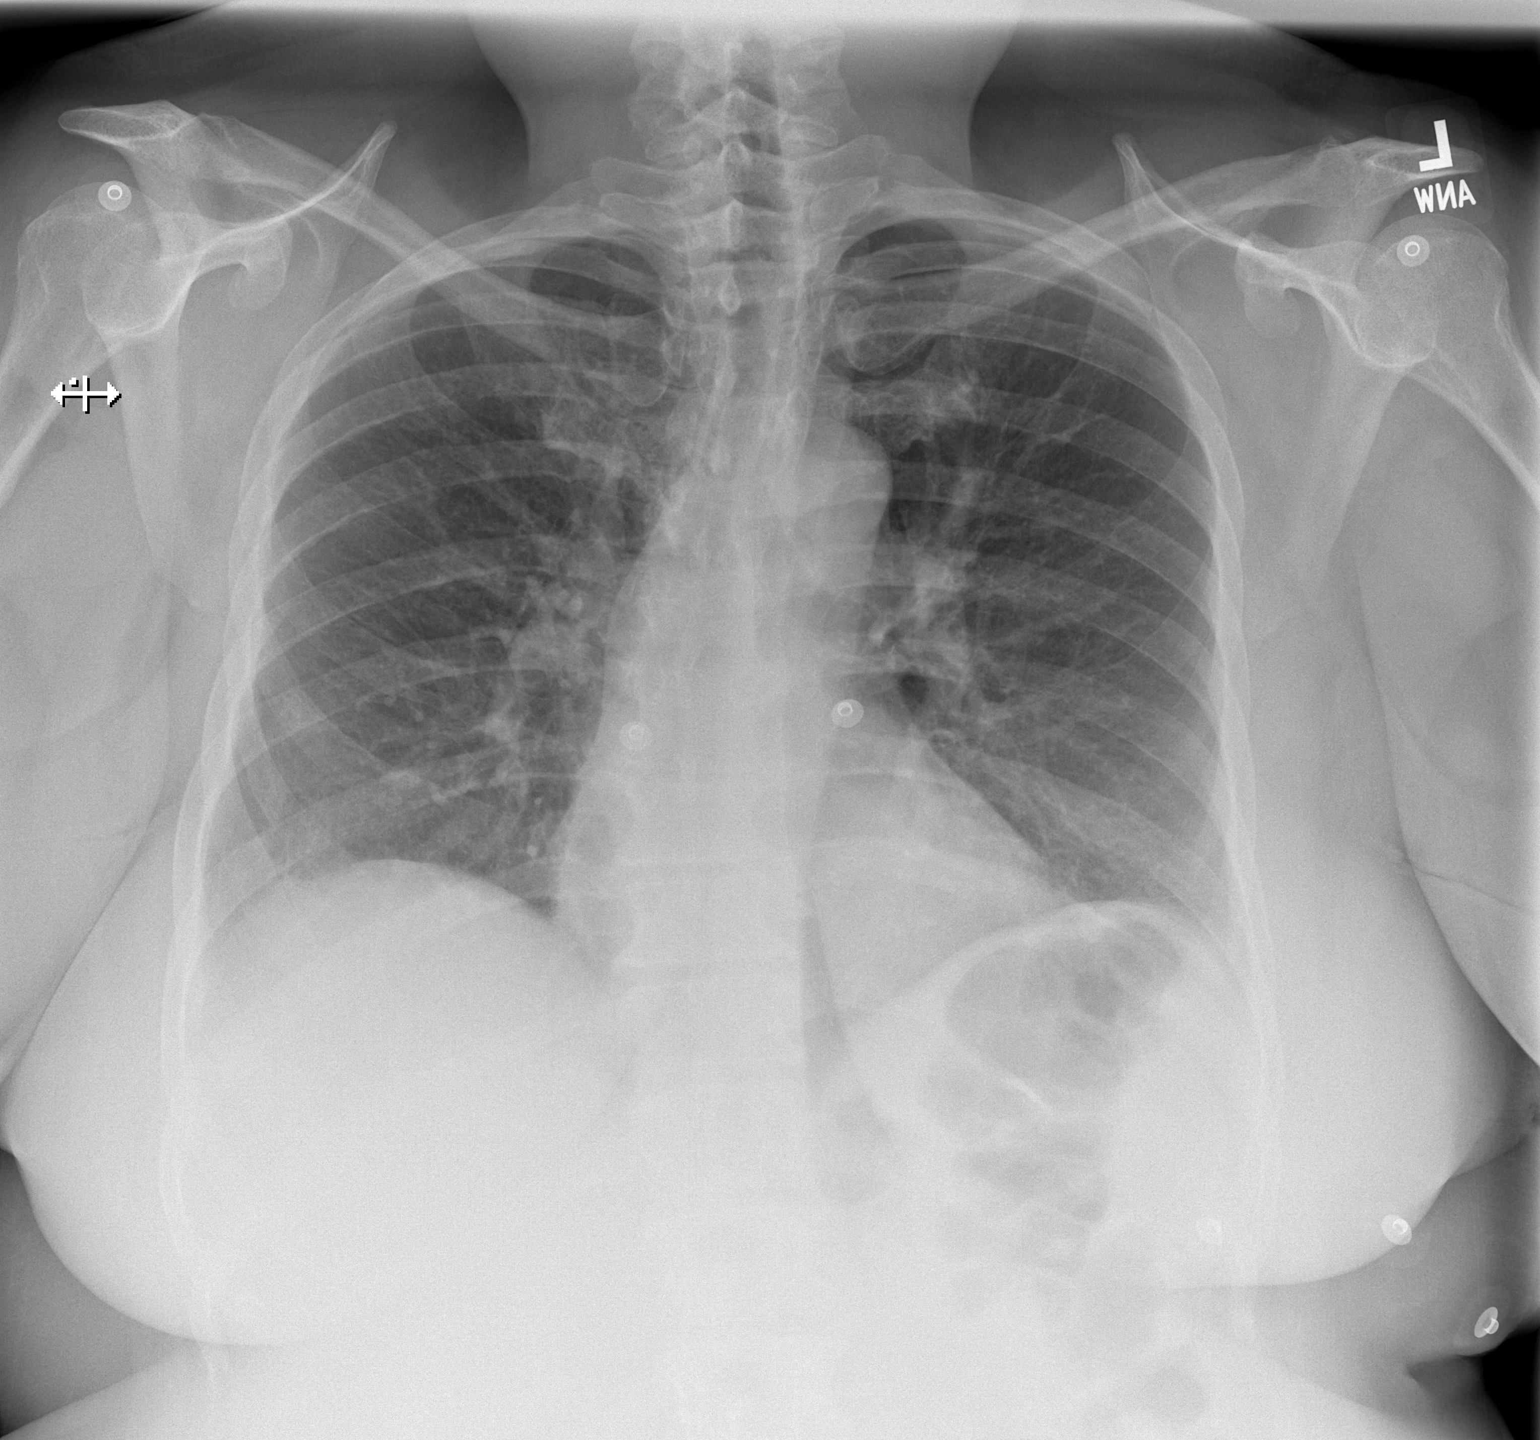

[w chest lat]
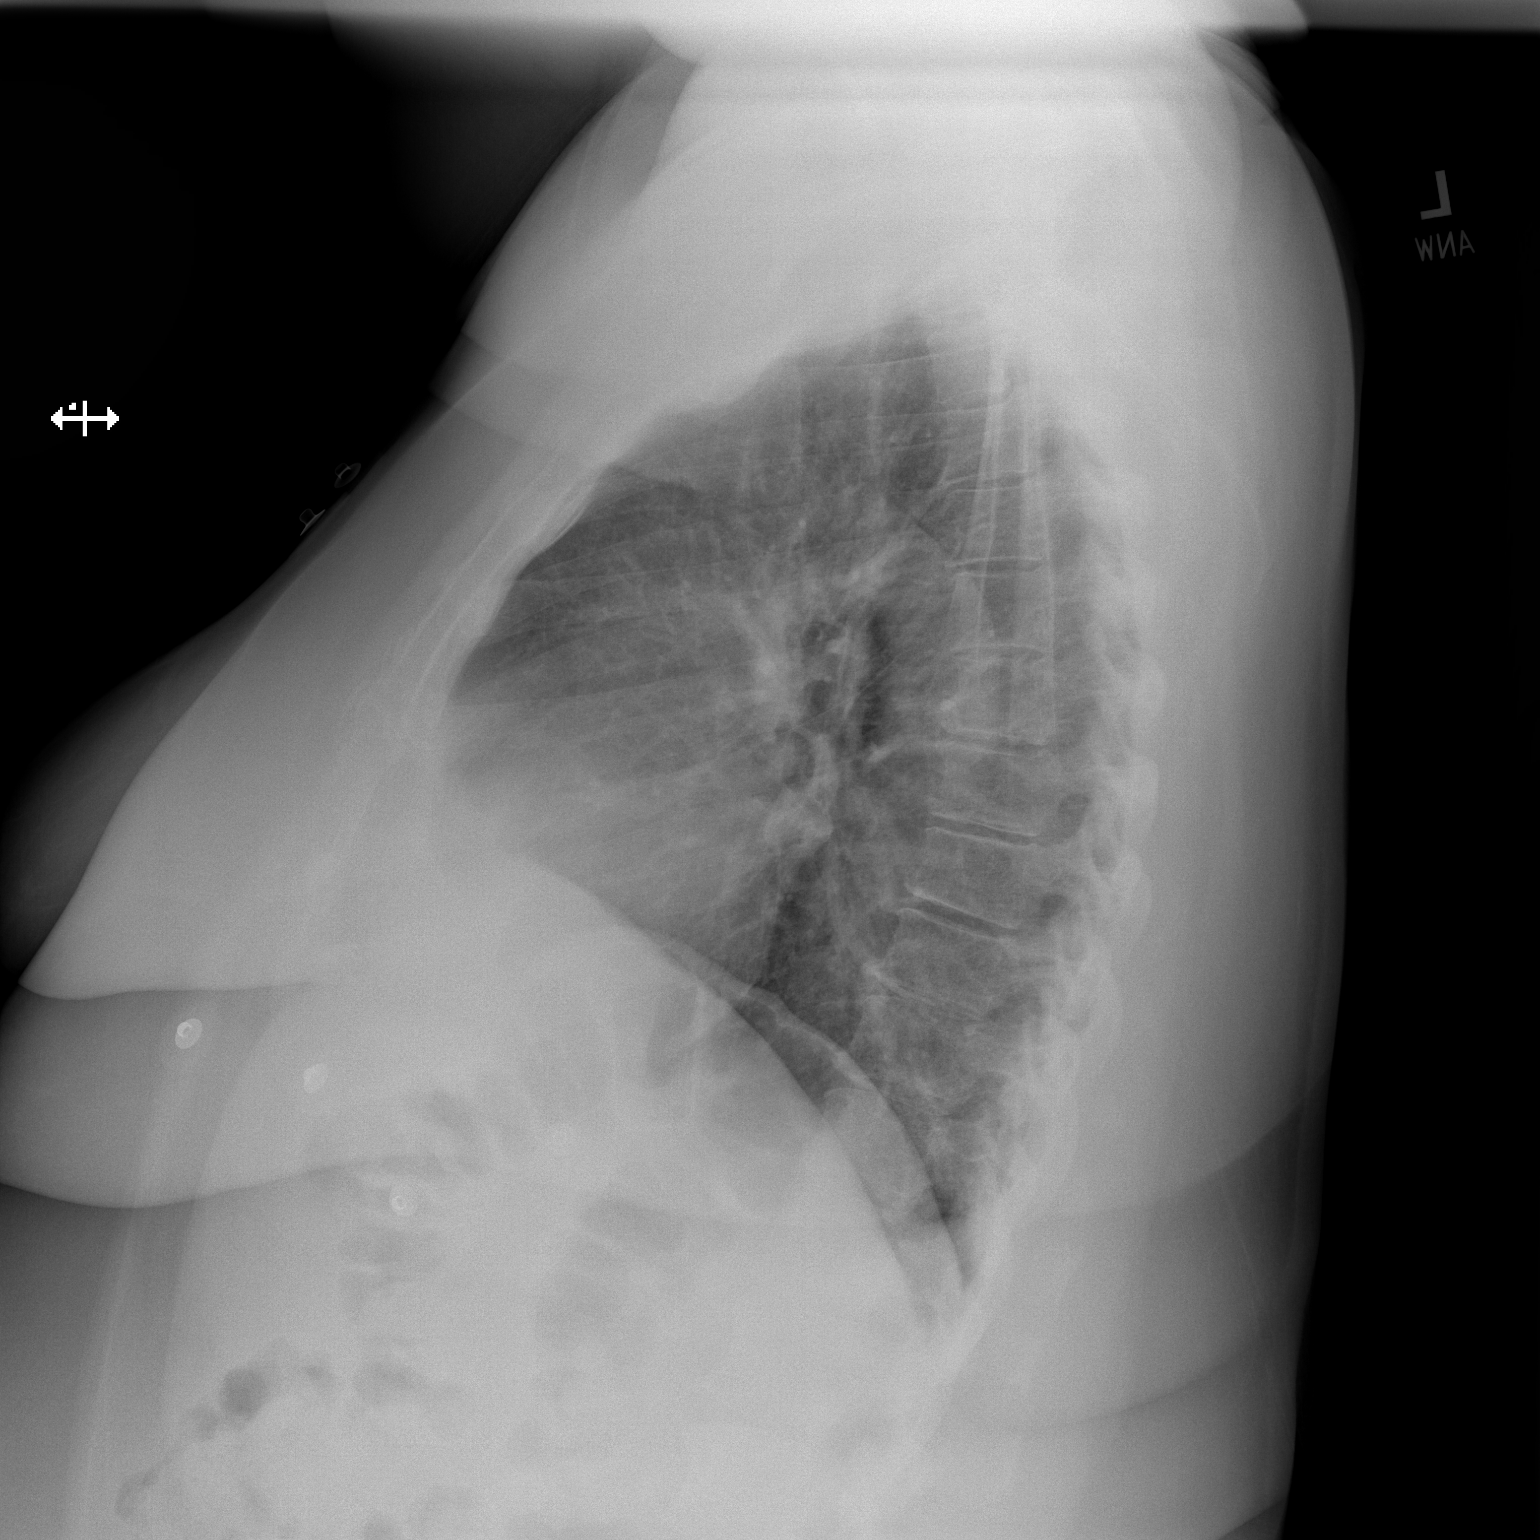

[2 of 2 positions shown; findings below may reference images not displayed]

FINDINGS: Minimal left basilar atelectasis. No pneumothorax or pleural
effusion. Cardiomediastinal silhouette within normal limits. No
acute osseous abnormality.
IMPRESSION: Minimal left basilar atelectasis.

Otherwise no focal airspace disease.

## 2022-06-10 ENCOUNTER — Encounter: Payer: Self-pay | Admitting: Gastroenterology

## 2022-06-10 ENCOUNTER — Ambulatory Visit: Payer: Federal, State, Local not specified - PPO | Admitting: Gastroenterology

## 2022-06-10 ENCOUNTER — Telehealth: Payer: Self-pay | Admitting: Gastroenterology

## 2022-06-10 VITALS — BP 124/80 | HR 74 | Ht 62.0 in | Wt 282.1 lb

## 2022-06-10 DIAGNOSIS — K8500 Idiopathic acute pancreatitis without necrosis or infection: Secondary | ICD-10-CM | POA: Diagnosis not present

## 2022-06-10 DIAGNOSIS — K5900 Constipation, unspecified: Secondary | ICD-10-CM

## 2022-06-10 NOTE — Progress Notes (Signed)
HPI :  55 year old female here for a follow-up visit to discuss her history of pancreatitis.  Recall she had pancreatitis in April 2021 of unclear etiology.  LFTs normal, not on any new medications at the time, no evidence of gallstones, she does not drink alcohol.  Unfortunately she had a recurrent episode of pancreatitis again this April 2023.  Again she had not been drinking any alcohol, she rarely ever drinks alcohol at all.  She had an ultrasound, CT scan which showed no evidence of gallstones.  Lipids normal, calcium level is normal.  She had subsequent IgG testing which was normal.  She had a follow-up MRCP in June which showed no evidence of mass lesion or concerning problems with her pancreas.  She does not smoke tobacco.  No family history of pancreatitis, no family history of pancreatic cancer.  The only change she has had in the past year or so regarding medicines was adding valsartan for hypertension.  She had her first episode of pancreatitis prior to starting this.  She does not take any other medications thought to cause pancreatitis.  She denies any herbal supplements.  She takes an over-the-counter multivitamin in addition to her medications as listed.  She has had no pain in between these episodes of pancreatitis.  She eats well.  She avoids heavy or fatty greasy foods.  Her gallbladder is in place.  She denies any blood in her stools.  She does have some constipation at baseline that bothers her with intermittent bloating and gas.  She is taking Benefiber for her constipation and it works okay.  She was on MiraLAX while in the hospital with her pancreatitis and states it worked better for her.  Her colonoscopy is up-to-date as below.  Previous GI Evaluation    May 2022 colonoscopy and EGD for IDA - Stool in the entire examined colon, able to be lavaged to complete the exam. - Diverticulosis in the transverse colon and in the left colon. - Three 3 to 4 mm polyps in the sigmoid colon,  removed with a cold snare. Resected and retrieved.    - Esophagogastric landmarks identified. - 2 cm hiatal hernia. - LA Grade A reflux esophagitis with slight nodularity, suspect inflammatory in nature. Biopsied. - Erythematous mucosa in the antrum. - Normal stomach otherwise - biopsies taken to rule out H pylori -Normal duodenal bulb and 2nd portion of duodenum.    Diagnosis 1. Surgical [P], gastric Anturm and gastric body - GASTRIC ANTRAL MUCOSA WITH NONSPECIFIC REACTIVE GASTROPATHY - GASTRIC OXYNTIC MUCOSA WITH NO SPECIFIC HISTOPATHOLOGIC CHANGES - WARTHIN STARRY STAIN IS NEGATIVE FOR HELICOBACTER PYLORI 2. Surgical [P], ge junction - ESOPHAGEAL SQUAMOUS AND CARDIAC MUCOSA WITH MILDLY ACTIVE CHRONIC NONSPECIFIC CARDITIS - NEGATIVE FOR INTESTINAL METAPLASIA OR DYSPLASIA 3. Surgical [P], colon, sigmoid x3, polyp (3) - HYPERPLASTIC POLYP(S) - OTHER FRAGMENT OF POLYPOID COLONIC MUCOSA WITH PROMINENT LYMPHOID AGGREGATE   Imaging     02/08/22 CTAP w/ contrast IMPRESSION: 1. Findings consistent with uncomplicated acute interstitial edematous pancreatitis. 2. No other significant findings. No evidence of cholelithiasis or biliary dilatation. 3. Prominent stool throughout the colon suggesting constipation. 4. Minimal Aortic Atherosclerosis (ICD10-I70.0).   RUQ US IMPRESSION: No evidence of gallstones or other significant right upper quadrant abdominal findings. Pancreas not imaged.   MRCP 04/12/22: IMPRESSION: 1. No MR evidence of acute pancreatitis. No acute inflammatory findings at this time. No pancreatic mass or pancreatic ductal dilatation. 2. No gallstones or biliary ductal dilatation. 3. Large burden of stool  in the included colon.  IgG4 level 27 (normal)    Past Medical History:  Diagnosis Date   Allergy    seasonal allergies   Arthritis    generalized   Asthma    uses inhaler   HTN (hypertension)    on meds   Pancreatitis    Pneumonia    Sickle cell trait  ([facility])    Tachycardia    hx of      Past Surgical History:  Procedure Laterality Date   INGUINAL HERNIA REPAIR Bilateral    LUNG LOBECTOMY Right    middle lobe (piece)   WISDOM TOOTH EXTRACTION     Family History  Problem Relation Age of Onset   Diabetes Mother    Hypertension Mother    Prostate cancer Father 20   Stroke Brother    Heart attack Brother    Diabetes Maternal Grandmother    Hypertension Maternal Grandmother    Esophageal cancer Neg Hx    Rectal cancer Neg Hx    Colon cancer Neg Hx    Colon polyps Neg Hx    Stomach cancer Neg Hx    Social History   Tobacco Use   Smoking status: Never   Smokeless tobacco: Never  Vaping Use   Vaping Use: Never used  Substance Use Topics   Alcohol use: Not Currently    Comment: occasional   Drug use: Never   Current Outpatient Medications  Medication Sig Dispense Refill   azelastine (ASTELIN) 0.1 % nasal spray Place 1 spray into both nostrils daily.     Cetirizine HCl (ZYRTEC PO) Take 1 tablet by mouth daily.     Cholecalciferol 50 MCG (2000 UT) CAPS Take 1 tablet by mouth daily.     fluticasone (FLONASE) 50 MCG/ACT nasal spray Place 1 spray into the nose daily.     Multiple Vitamin (MULTI-VITAMIN) tablet Take 1 tablet by mouth daily.     valsartan (DIOVAN) 160 MG tablet Take 160 mg by mouth daily.     verapamil (CALAN-SR) 240 MG CR tablet 120 mg daily.     WIXELA INHUB 250-50 MCG/DOSE AEPB Inhale 1 puff into the lungs daily.     acetaminophen (TYLENOL) 500 MG tablet Take 1,000 mg by mouth daily as needed for headache. (Patient not taking: Reported on 06/10/2022)     albuterol (VENTOLIN HFA) 108 (90 Base) MCG/ACT inhaler Inhale 2 puffs into the lungs 2 (two) times daily as needed (asthma). (Patient not taking: Reported on 06/10/2022)     omeprazole (PRILOSEC) 20 MG capsule Take 1 capsule (20 mg total) by mouth daily. Take daily for 6 weeks. (Patient not taking: Reported on 06/10/2022) 90 capsule 3   ondansetron (ZOFRAN-ODT)  4 MG disintegrating tablet Take 1 tablet (4 mg total) by mouth every 8 (eight) hours as needed for nausea or vomiting. (Patient not taking: Reported on 06/10/2022) 15 tablet 0   No current facility-administered medications for this visit.   No Known Allergies   Review of Systems: All systems reviewed and negative except where noted in HPI.   Lab Results  Component Value Date   ALT 13 02/09/2022   AST 17 02/09/2022   ALKPHOS 52 02/09/2022   BILITOT 0.8 02/09/2022     Lab Results  Component Value Date   CREATININE 0.78 02/10/2022   BUN <5 (L) 02/10/2022   NA 137 02/10/2022   K 4.0 02/10/2022   CL 104 02/10/2022   CO2 28 02/10/2022     Physical Exam:  BP 124/80   Pulse 74   Ht '5\' 2"'$  (1.575 m)   Wt 282 lb 1.6 oz (128 kg)   SpO2 96%   BMI 51.60 kg/m  Constitutional: Pleasant,well-developed, female in no acute distress. Skin: Skin is warm and dry. No rashes noted. Psychiatric: Normal mood and affect. Behavior is normal.   ASSESSMENT: 56 y.o. female here for assessment of the following  1. Idiopathic acute pancreatitis without infection or necrosis   2. Constipation, unspecified constipation type    Patient has had 2 episodes of acute pancreatitis without clear etiology over time.  Her LFTs were normal and no evidence of gallstones or sludge in her gallbladder, making biliary pancreatitis less likely.  She does not drink any alcohol.  Triglycerides normal, calcium level is normal.  She has no family history of pancreatitis or pancreatic cancer.  Her IgG4 level is normal.  Her MRCP showed no concerning pancreatic pathology.  She is quite anxious about another episode of recurrent pancreatitis and inquires what she can do to prevent this.  I am going to speak with my advanced endoscopy colleagues about role of EUS in the situation.  We also discussed that while her episodes are not typical for biliary pancreatitis, one could consider empiric cholecystectomy for recurrent  pancreatitis of unclear etiology otherwise, understanding the risk of that is that she may have an operation and it may not prevent recurrent episodes.  I will get back to her after I discussed her case with my advanced endoscopy partners in regards to potential EUS, with further recommendations.  Otherwise for her constipation sounds like she would do better with MiraLAX daily and can titrate up or down as needed.  She will stop Benefiber.  PLAN: - I will discuss her case with my advanced endoscopy colleagues in regards to possible EUS, versus consideration for referral for empiric cholecystectomy  - stop daily fiber supplement - start Miralax daily to BID, titrate as needed - call if persistent constipation and can try Linzess  Jolly Mango, MD [facility]

## 2022-06-10 NOTE — Telephone Encounter (Signed)
Spoke with my advanced endoscopy colleague Dr. Rush Landmark. EUS reasonable to further evaluate recurrent idiopathic pancreatitis. Patient was more interested in this rather than empiric cholecystectomy at time of office visit. I explained to her what this is at the office visit and she was agreeable to it.  Patty can you let her know that I am recommending EUS. Can you help her schedule with GM? Staff message sent to you with details of our conversation. Thanks

## 2022-06-10 NOTE — Patient Instructions (Signed)
Start over the counter Miralax mixing 17 grams in 8 oz of water/juice 1-2 x daily. You can titrate as needed for bowel movements.  Call our office if your constipation persists.   We will contact you regarding possibly scheduling a Endoscopic ultrasound.  The [place] [facility] providers would like to encourage you to use [facility] to communicate with providers for non-urgent requests or questions.  Due to long hold times on the telephone, sending your provider a message by [facility] may be a faster and more efficient way to get a response.  Please allow 48 business hours for a response.  Please remember that this is for non-urgent requests.

## 2022-06-11 NOTE — Telephone Encounter (Signed)
Mansouraty, Telford Nab., MD  Armbruster, Carlota Raspberry, MD; Timothy Lasso, RN SA,  Sounds good.   Alicia Ritter,  Please schedule this EUS in the next couple of months.  Thanks.  GM

## 2022-06-14 ENCOUNTER — Other Ambulatory Visit: Payer: Self-pay

## 2022-06-14 DIAGNOSIS — K8500 Idiopathic acute pancreatitis without necrosis or infection: Secondary | ICD-10-CM

## 2022-06-14 NOTE — Telephone Encounter (Signed)
EUS has been scheduled for 10/12 at 1245 pm at [facility] with GM   Left message on machine to call back

## 2022-06-15 NOTE — Telephone Encounter (Signed)
EUS scheduled, pt instructed and medications reviewed.  Patient instructions mailed to home and sent to My Chart .  Patient to call with any questions or concerns.  

## 2022-07-28 ENCOUNTER — Telehealth: Payer: Self-pay

## 2022-07-28 NOTE — Telephone Encounter (Signed)
Ailene Ravel,  Thank you for letting Makyna Niehoff and I know about her canceling.  GM    Morine Kohlman,   If you can reach out to her at some point this week.  Please ask her if she is interested in rescheduling this year otherwise if she is not then she can call back at her desired time and she can come off our list.  We will then let Dr. Havery Moros know and he can refer back if necessary.  Thanks.  GM  ----- Message -----  From: Graylon Gunning, RN  Sent: 07/27/2022   4:25 PM EDT  To: Irving Copas., MD  Subject: Cancellation of Order # [id number]                Order number [id number] for the procedure CASE REQUEST OPERATING   ROOM [SUR1] has been canceled by Graylon Gunning, RN   [[id number]]. This procedure was ordered by Irving Copas., MD [[id number]] on Jun 14, 2022 for the patient Alicia Ritter [[id number]]. The reason for cancellation was "None".    This was a standing order with 0 occurrences remaining.

## 2022-07-28 NOTE — Telephone Encounter (Signed)
Tried again to reach pt and voice mail full will attempt later

## 2022-07-28 NOTE — Telephone Encounter (Signed)
Left message on machine to call back  

## 2022-07-29 ENCOUNTER — Encounter (HOSPITAL_COMMUNITY): Admission: RE | Payer: Self-pay | Source: Home / Self Care

## 2022-07-29 ENCOUNTER — Ambulatory Visit (HOSPITAL_COMMUNITY)
Admission: RE | Admit: 2022-07-29 | Payer: Federal, State, Local not specified - PPO | Source: Home / Self Care | Admitting: Gastroenterology

## 2022-07-29 SURGERY — UPPER ENDOSCOPIC ULTRASOUND (EUS) RADIAL
Anesthesia: Monitor Anesthesia Care

## 2022-07-29 NOTE — Telephone Encounter (Signed)
Tried again to reach pt no answer voice mail full

## 2022-07-30 NOTE — Telephone Encounter (Signed)
No answer no voice mail  

## 2022-08-02 NOTE — Telephone Encounter (Signed)
Unable to reach pt by phone letter mailed.  

## 2023-02-14 ENCOUNTER — Ambulatory Visit: Payer: Federal, State, Local not specified - PPO | Admitting: Podiatry

## 2023-02-14 ENCOUNTER — Encounter: Payer: Self-pay | Admitting: Podiatry

## 2023-02-14 ENCOUNTER — Ambulatory Visit (INDEPENDENT_AMBULATORY_CARE_PROVIDER_SITE_OTHER): Payer: Federal, State, Local not specified - PPO

## 2023-02-14 DIAGNOSIS — S99921A Unspecified injury of right foot, initial encounter: Secondary | ICD-10-CM

## 2023-02-14 DIAGNOSIS — M79671 Pain in right foot: Secondary | ICD-10-CM

## 2023-02-14 MED ORDER — MELOXICAM 15 MG PO TABS: 15.0000 mg | ORAL_TABLET | Freq: Every day | ORAL | 1 refills | Status: AC

## 2023-02-14 NOTE — Progress Notes (Signed)
   Chief Complaint  Patient presents with   Foot Injury    Patient came in today for right foot injury, to the bunion,  3 days ago the patient stepped on a lego, the next the patient foot was swelling and red and sore, patient rates the pain a 6 out of 10,  X-Rays taken today     HPI: 57 y.o. female presenting today as a new patient for evaluation of pain and tenderness associated to the right great toe joint.  Patient states that on Thursday, 02/10/2023, she stepped on a Lego.  Over the following 2 days she had increased pain and tenderness associated to the foot.  She now presents for further treatment evaluation.  Currently she has not anything for treatment  Past Medical History:  Diagnosis Date   Allergy    seasonal allergies   Arthritis    generalized   Asthma    uses inhaler   HTN (hypertension)    on meds   Pancreatitis    Pneumonia    Sickle cell trait (HCC)    Tachycardia    hx of     Past Surgical History:  Procedure Laterality Date   INGUINAL HERNIA REPAIR Bilateral    LUNG LOBECTOMY Right    middle lobe (piece)   WISDOM TOOTH EXTRACTION      No Known Allergies   Physical Exam: General: The patient is alert and oriented x3 in no acute distress.  Dermatology: Skin is warm, dry and supple bilateral lower extremities.  There are no open wounds or puncture lesions around the area of concern  Vascular: Palpable pedal pulses bilaterally. Capillary refill within normal limits.  No appreciable edema.  No erythema.  Neurological: Grossly intact via light touch  Musculoskeletal Exam: Erythema with edema noted localized around the first MTP of the right foot.  Highly doubt any infection or cellulitis since there is no entry point or puncture lesion to the left foot.  Most tender around the plantar aspect of the foot at the area of injury  Radiographic Exam RT foot 02/14/2023:  Degenerative changes noted throughout the midfoot.  Hallux valgus deformity with hypertrophic  medial eminence also noted to the first metatarsal head.  No acute fractures identified.  Assessment/Plan of Care: 1.  forefoot injury/contusion first MTP right  -Patient evaluated.  X-rays reviewed. For now recommend conservative treatment.   -Cam boot dispensed.  WBAT -Prescription for meloxicam 15 mg daily -Patient does not have work over the following 2 days.  Recommend icing daily with elevation and rest -Note for work was provided.  No work 5/1 thru 02/20/2023.  -Return to clinic 3 weeks  *Works at [employer]     Felecia Shelling, DPM [facility]  Dr. Felecia Shelling, DPM    [address] [place], [place] [address]                Office [phone]  Fax [phone]

## 2023-02-15 ENCOUNTER — Other Ambulatory Visit: Payer: Self-pay | Admitting: Podiatry

## 2023-02-15 DIAGNOSIS — M79671 Pain in right foot: Secondary | ICD-10-CM

## 2023-02-15 DIAGNOSIS — S99921A Unspecified injury of right foot, initial encounter: Secondary | ICD-10-CM

## 2023-03-07 ENCOUNTER — Ambulatory Visit: Payer: Federal, State, Local not specified - PPO | Admitting: Podiatry

## 2023-03-07 DIAGNOSIS — S99921A Unspecified injury of right foot, initial encounter: Secondary | ICD-10-CM | POA: Diagnosis not present

## 2023-03-07 NOTE — Progress Notes (Signed)
   Chief Complaint  Patient presents with   Foot Pain    Patient came in today for right foot pain, patient denies any pain, patient states she is doing much better     HPI: 57 y.o. female presenting today for follow-up evaluation of pain and tenderness associated to the right great toe joint.  Patient states that on Thursday, 02/10/2023, she stepped on a Lego.  Over the following 2 days she had increased pain and tenderness associated to the foot.   Overall the patient states that she feels tremendously better.  No pain or tenderness associated to the foot.  No new complaints  Past Medical History:  Diagnosis Date   Allergy    seasonal allergies   Arthritis    generalized   Asthma    uses inhaler   HTN (hypertension)    on meds   Pancreatitis    Pneumonia    Sickle cell trait (HCC)    Tachycardia    hx of     Past Surgical History:  Procedure Laterality Date   INGUINAL HERNIA REPAIR Bilateral    LUNG LOBECTOMY Right    middle lobe (piece)   WISDOM TOOTH EXTRACTION      No Known Allergies   Physical Exam: General: The patient is alert and oriented x3 in no acute distress.  Dermatology: Skin is warm, dry and supple bilateral lower extremities.  There are no open wounds or puncture lesions around the area of concern  Vascular: Palpable pedal pulses bilaterally. Capillary refill within normal limits.  No appreciable edema.  No erythema.  Neurological: Grossly intact via light touch  Musculoskeletal Exam: Significant improvement of the erythema with edema noted localized around the first MTP of the right foot.  No tenderness around the plantar aspect of the foot at the area of injury  Radiographic Exam RT foot 02/14/2023:  Degenerative changes noted throughout the midfoot.  Hallux valgus deformity with hypertrophic medial eminence also noted to the first metatarsal head.  No acute fractures identified.  Assessment/Plan of Care: 1.  forefoot injury/contusion first MTP  right  -Patient evaluated.   -Overall the patient feels significantly better. -Patient may return to work full activity no restrictions -Recommend good supportive tennis shoes and sneakers.  Discontinue cam boot. -Return to clinic as needed  *Works at [employer]     Felecia Shelling, DPM [facility]  Dr. Felecia Shelling, DPM    [address] [place], [place] [address]                Office [phone]  Fax [phone]

## 2023-10-20 ENCOUNTER — Ambulatory Visit (HOSPITAL_BASED_OUTPATIENT_CLINIC_OR_DEPARTMENT_OTHER): Payer: Federal, State, Local not specified - PPO

## 2023-10-20 ENCOUNTER — Encounter (HOSPITAL_BASED_OUTPATIENT_CLINIC_OR_DEPARTMENT_OTHER): Payer: Self-pay | Admitting: Student

## 2023-10-20 ENCOUNTER — Ambulatory Visit (HOSPITAL_BASED_OUTPATIENT_CLINIC_OR_DEPARTMENT_OTHER): Payer: Federal, State, Local not specified - PPO | Admitting: Student

## 2023-10-20 DIAGNOSIS — M1711 Unilateral primary osteoarthritis, right knee: Secondary | ICD-10-CM

## 2023-10-20 DIAGNOSIS — M25561 Pain in right knee: Secondary | ICD-10-CM | POA: Diagnosis not present

## 2023-10-20 MED ORDER — TRIAMCINOLONE ACETONIDE 40 MG/ML IJ SUSP
2.0000 mL | INTRAMUSCULAR | Status: AC | PRN
  Administered 2023-10-20: 2 mL via INTRA_ARTICULAR

## 2023-10-20 MED ORDER — LIDOCAINE HCL 1 % IJ SOLN
4.0000 mL | INTRAMUSCULAR | Status: AC | PRN
  Administered 2023-10-20: 4 mL

## 2023-10-20 NOTE — Progress Notes (Signed)
 Chief Complaint: Right knee pain     History of Present Illness:    [name] is a 58 y.o. female presenting today for evaluation of right knee pain.  Pain began 2 to 3 months ago without any known cause or injury.  States that pain is on both sides of the knee and she rates at an 8/10 today.  She does note frequent swelling and popping in the knee.  Was seen by urgent care for this a few weeks ago and was given meloxicam .  Pain levels have continued to worsen.  Has tried heat and ice as well.  Works for [employer] at [employer] and does have to frequently walk and stand.   Surgical History:   None  PMH/PSH/Family History/Social History/Meds/Allergies:    Past Medical History:  Diagnosis Date   Allergy    seasonal allergies   Arthritis    generalized   Asthma    uses inhaler   HTN (hypertension)    on meds   Pancreatitis    Pneumonia    Sickle cell trait (HCC)    Tachycardia    hx of    Past Surgical History:  Procedure Laterality Date   INGUINAL HERNIA REPAIR Bilateral    LUNG LOBECTOMY Right    middle lobe (piece)   WISDOM TOOTH EXTRACTION     Social History   Socioeconomic History   Marital status: Single    Spouse name: Not on file   Number of children: 4   Years of education: Not on file   Highest education level: Not on file  Occupational History   Occupation: Theatre Stage Manager  Tobacco Use   Smoking status: Never   Smokeless tobacco: Never  Vaping Use   Vaping status: Never Used  Substance and Sexual Activity   Alcohol use: Not Currently    Comment: occasional   Drug use: Never   Sexual activity: Not on file  Other Topics Concern   Not on file  Social History Narrative   Not on file   Social Drivers of Health   Financial Resource Strain: Not on file  Food Insecurity: Not on file  Transportation Needs: Not on file  Physical Activity: Not on file  Stress: Not on file  Social Connections: Not  on file   Family History  Problem Relation Age of Onset   Diabetes Mother    Hypertension Mother    Prostate cancer Father 52   Stroke Brother    Heart attack Brother    Diabetes Maternal Grandmother    Hypertension Maternal Grandmother    Esophageal cancer Neg Hx    Rectal cancer Neg Hx    Colon cancer Neg Hx    Colon polyps Neg Hx    Stomach cancer Neg Hx    No Known Allergies Current Outpatient Medications  Medication Sig Dispense Refill   acetaminophen  (TYLENOL ) 500 MG tablet Take 1,000 mg by mouth daily as needed for headache. (Patient not taking: Reported on 06/10/2022)     albuterol  (VENTOLIN  HFA) 108 (90 Base) MCG/ACT inhaler Inhale 2 puffs into the lungs 2 (two) times daily as needed (asthma). (Patient not taking: Reported on 06/10/2022)     azelastine  (ASTELIN ) 0.1 % nasal spray Place 1 spray into both nostrils daily.     Cetirizine  HCl (ZYRTEC   PO) Take 1 tablet by mouth daily.     Cholecalciferol 50 MCG (2000 UT) CAPS Take 1 tablet by mouth daily.     fluticasone  (FLONASE ) 50 MCG/ACT nasal spray Place 1 spray into the nose daily.     meloxicam  (MOBIC ) 15 MG tablet Take 1 tablet (15 mg total) by mouth daily. 30 tablet 1   Multiple Vitamin (MULTI-VITAMIN) tablet Take 1 tablet by mouth daily.     omeprazole  (PRILOSEC) 20 MG capsule Take 1 capsule (20 mg total) by mouth daily. Take daily for 6 weeks. (Patient not taking: Reported on 06/10/2022) 90 capsule 3   ondansetron  (ZOFRAN -ODT) 4 MG disintegrating tablet Take 1 tablet (4 mg total) by mouth every 8 (eight) hours as needed for nausea or vomiting. (Patient not taking: Reported on 06/10/2022) 15 tablet 0   valsartan (DIOVAN) 160 MG tablet Take 160 mg by mouth daily.     verapamil  (CALAN -SR) 240 MG CR tablet 120 mg daily.     WIXELA INHUB 250-50 MCG/DOSE AEPB Inhale 1 puff into the lungs daily.     No current facility-administered medications for this visit.   No results found.  Review of Systems:   A ROS was performed  including pertinent positives and negatives as documented in the HPI.  Physical Exam :   Constitutional: NAD and appears stated age Neurological: Alert and oriented Psych: Appropriate affect and cooperative Last menstrual period 09/15/2020.   Comprehensive Musculoskeletal Exam:    Right knee exam demonstrates presence of a mild effusion without overlying erythema or warmth.  Active range of motion from 0 to 100 degrees with palpable crepitus.  Lateral joint line tenderness with palpation.  No laxity with varus or valgus stress.  Knee flexion and extension strength is 5/5.  Imaging:   Xray (right knee 4 views): Isolated moderate patellofemoral compartment osteoarthritis with notable osteophytes.  Tibiofemoral joint spaces well-preserved.  No evidence of acute abnormality.   I personally reviewed and interpreted the radiographs.   Assessment:   58 y.o. female with acute right knee pain.  She does have evidence of patellofemoral osteoarthritis on today's x-rays which I do believe is the likely cause of her symptoms.  No significant history of knee injury or surgery.  Discussed this pathology as well as different treatment options.  She is scheduled to go to work beginning tomorrow and would like something for immediate relief.  Patient is agreeable to a cortisone injection today which was performed without any complication.  Will plan to assess relief with this and have her follow-up as needed.  Plan :    -Right knee cortisone injection performed today -Return to clinic as needed    Procedure Note  Patient: Alicia Ritter             Date of Birth: Sep 08, 1966           MRN: [id number]             Visit Date: 10/20/2023  Procedures: Visit Diagnoses:  1. Osteoarthritis of right patellofemoral joint     Large Joint Inj: R knee on 10/20/2023 1:09 PM Indications: pain Details: 22 G 1.5 in needle, anterolateral approach Medications: 4 mL lidocaine  1 %; 2 mL triamcinolone  acetonide 40  MG/ML Outcome: tolerated well, no immediate complications Procedure, treatment alternatives, risks and benefits explained, specific risks discussed. Consent was given by the patient. Immediately prior to procedure a time out was called to verify the correct patient, procedure, equipment, support staff and site/side marked as required.  Patient was prepped and draped in the usual sterile fashion.      I personally saw and evaluated the patient, and participated in the management and treatment plan.  Leonce Reveal, PA-C Orthopedics

## 2024-08-20 ENCOUNTER — Encounter: Payer: Self-pay | Admitting: Radiology
